# Patient Record
Sex: Female | Born: 1965 | Race: White | Hispanic: No | Marital: Married | Smoking: Never smoker
Health system: Southern US, Community
[De-identification: ages and names within clinical notes are randomized; demographics above are authoritative.]

## PROBLEM LIST (undated history)

## (undated) DIAGNOSIS — E119 Type 2 diabetes mellitus without complications: Secondary | ICD-10-CM

## (undated) DIAGNOSIS — M1990 Unspecified osteoarthritis, unspecified site: Secondary | ICD-10-CM

## (undated) DIAGNOSIS — J189 Pneumonia, unspecified organism: Secondary | ICD-10-CM

## (undated) DIAGNOSIS — G629 Polyneuropathy, unspecified: Secondary | ICD-10-CM

## (undated) HISTORY — DX: Pneumonia, unspecified organism: J18.9

## (undated) HISTORY — DX: Unspecified osteoarthritis, unspecified site: M19.90

## (undated) HISTORY — DX: Polyneuropathy, unspecified: G62.9

## (undated) HISTORY — DX: Type 2 diabetes mellitus without complications: E11.9

---

## 1999-09-01 ENCOUNTER — Other Ambulatory Visit: Admission: RE | Admit: 1999-09-01 | Discharge: 1999-09-01 | Payer: Self-pay | Admitting: Obstetrics and Gynecology

## 2000-10-11 ENCOUNTER — Other Ambulatory Visit: Admission: RE | Admit: 2000-10-11 | Discharge: 2000-10-11 | Payer: Self-pay | Admitting: Obstetrics and Gynecology

## 2001-01-04 ENCOUNTER — Inpatient Hospital Stay (HOSPITAL_COMMUNITY): Admission: AD | Admit: 2001-01-04 | Discharge: 2001-01-04 | Payer: Self-pay | Admitting: Obstetrics and Gynecology

## 2001-01-04 ENCOUNTER — Encounter: Payer: Self-pay | Admitting: Obstetrics and Gynecology

## 2001-02-28 ENCOUNTER — Encounter: Admission: RE | Admit: 2001-02-28 | Discharge: 2001-05-29 | Payer: Self-pay | Admitting: Obstetrics and Gynecology

## 2001-06-07 ENCOUNTER — Inpatient Hospital Stay (HOSPITAL_COMMUNITY): Admission: AD | Admit: 2001-06-07 | Discharge: 2001-06-07 | Payer: Self-pay | Admitting: Obstetrics and Gynecology

## 2001-08-18 ENCOUNTER — Inpatient Hospital Stay (HOSPITAL_COMMUNITY): Admission: AD | Admit: 2001-08-18 | Discharge: 2001-08-18 | Payer: Self-pay | Admitting: Obstetrics & Gynecology

## 2001-08-19 ENCOUNTER — Encounter (INDEPENDENT_AMBULATORY_CARE_PROVIDER_SITE_OTHER): Payer: Self-pay | Admitting: Specialist

## 2001-08-19 ENCOUNTER — Inpatient Hospital Stay (HOSPITAL_COMMUNITY): Admission: AD | Admit: 2001-08-19 | Discharge: 2001-08-22 | Payer: Self-pay | Admitting: Obstetrics and Gynecology

## 2001-09-15 ENCOUNTER — Encounter: Admission: RE | Admit: 2001-09-15 | Discharge: 2001-10-15 | Payer: Self-pay | Admitting: Obstetrics and Gynecology

## 2001-09-22 ENCOUNTER — Other Ambulatory Visit: Admission: RE | Admit: 2001-09-22 | Discharge: 2001-09-22 | Payer: Self-pay | Admitting: Obstetrics and Gynecology

## 2001-11-03 ENCOUNTER — Encounter: Payer: Self-pay | Admitting: Obstetrics and Gynecology

## 2001-11-03 ENCOUNTER — Encounter: Admission: RE | Admit: 2001-11-03 | Discharge: 2001-11-03 | Payer: Self-pay | Admitting: Obstetrics and Gynecology

## 2002-11-04 ENCOUNTER — Other Ambulatory Visit: Admission: RE | Admit: 2002-11-04 | Discharge: 2002-11-04 | Payer: Self-pay | Admitting: Obstetrics and Gynecology

## 2003-06-04 ENCOUNTER — Other Ambulatory Visit: Admission: RE | Admit: 2003-06-04 | Discharge: 2003-06-04 | Payer: Self-pay | Admitting: Obstetrics and Gynecology

## 2003-10-05 ENCOUNTER — Inpatient Hospital Stay (HOSPITAL_COMMUNITY): Admission: AD | Admit: 2003-10-05 | Discharge: 2003-10-05 | Payer: Self-pay | Admitting: Obstetrics and Gynecology

## 2003-12-07 ENCOUNTER — Encounter (INDEPENDENT_AMBULATORY_CARE_PROVIDER_SITE_OTHER): Payer: Self-pay | Admitting: Specialist

## 2003-12-07 ENCOUNTER — Inpatient Hospital Stay (HOSPITAL_COMMUNITY): Admission: RE | Admit: 2003-12-07 | Discharge: 2003-12-09 | Payer: Self-pay | Admitting: Obstetrics and Gynecology

## 2003-12-10 ENCOUNTER — Encounter: Admission: RE | Admit: 2003-12-10 | Discharge: 2004-01-09 | Payer: Self-pay | Admitting: Obstetrics and Gynecology

## 2004-01-18 ENCOUNTER — Other Ambulatory Visit: Admission: RE | Admit: 2004-01-18 | Discharge: 2004-01-18 | Payer: Self-pay | Admitting: Obstetrics and Gynecology

## 2004-09-14 ENCOUNTER — Other Ambulatory Visit: Admission: RE | Admit: 2004-09-14 | Discharge: 2004-09-14 | Payer: Self-pay | Admitting: Obstetrics and Gynecology

## 2004-09-25 ENCOUNTER — Observation Stay (HOSPITAL_COMMUNITY): Admission: AD | Admit: 2004-09-25 | Discharge: 2004-09-25 | Payer: Self-pay | Admitting: Obstetrics and Gynecology

## 2005-01-16 ENCOUNTER — Inpatient Hospital Stay (HOSPITAL_COMMUNITY): Admission: AD | Admit: 2005-01-16 | Discharge: 2005-01-16 | Payer: Self-pay | Admitting: Obstetrics and Gynecology

## 2005-02-19 ENCOUNTER — Inpatient Hospital Stay (HOSPITAL_COMMUNITY): Admission: AD | Admit: 2005-02-19 | Discharge: 2005-02-19 | Payer: Self-pay | Admitting: Obstetrics and Gynecology

## 2005-03-01 ENCOUNTER — Inpatient Hospital Stay (HOSPITAL_COMMUNITY): Admission: AD | Admit: 2005-03-01 | Discharge: 2005-03-03 | Payer: Self-pay | Admitting: Obstetrics and Gynecology

## 2005-03-01 ENCOUNTER — Encounter (INDEPENDENT_AMBULATORY_CARE_PROVIDER_SITE_OTHER): Payer: Self-pay | Admitting: *Deleted

## 2005-03-04 ENCOUNTER — Encounter: Admission: RE | Admit: 2005-03-04 | Discharge: 2005-04-03 | Payer: Self-pay | Admitting: Obstetrics and Gynecology

## 2005-04-04 ENCOUNTER — Other Ambulatory Visit: Admission: RE | Admit: 2005-04-04 | Discharge: 2005-04-04 | Payer: Self-pay | Admitting: Obstetrics and Gynecology

## 2007-04-18 HISTORY — PX: TUBAL LIGATION: SHX77

## 2008-05-14 ENCOUNTER — Encounter (INDEPENDENT_AMBULATORY_CARE_PROVIDER_SITE_OTHER): Payer: Self-pay | Admitting: Obstetrics and Gynecology

## 2008-05-14 ENCOUNTER — Ambulatory Visit (HOSPITAL_COMMUNITY): Admission: RE | Admit: 2008-05-14 | Discharge: 2008-05-14 | Payer: Self-pay | Admitting: Obstetrics and Gynecology

## 2011-01-30 NOTE — Op Note (Signed)
NAMECHARLOT, Brandi Weber             ACCOUNT NO.:  [id number]   MEDICAL RECORD NO.:  [id number]          PATIENT TYPE:  AMB   LOCATION:  SDC                           FACILITY:  [facility]   PHYSICIAN:  Guy Sandifer. Henderson Cloud, M.D. DATE OF BIRTH:  1966/02/05   DATE OF PROCEDURE:  05/14/2008  DATE OF DISCHARGE:                               OPERATIVE REPORT   PREOPERATIVE DIAGNOSES:  1. Menorrhagia.  2. Desires permanent sterilization.  3. Left ovarian cyst.   POSTOPERATIVE DIAGNOSIS:  1. Menorrhagia.  2. Desires permanent sterilization.  3. Left ovarian cyst.   PROCEDURES:  1. NovaSure endometrial ablation.  2. Laparoscopy with left ovarian cystectomy and bilateral tubal      ligation.  3. Hysteroscopy.  4. Dilatation and curettage.  5. Removal of intrauterine device.   SURGEON:  Guy Sandifer. Henderson Cloud, MD   ANESTHESIA:  General with endotracheal intubation.   SPECIMENS:  1. Endometrial curettings.  2. Left ovarian cystectomy.  3. IUD all to pathology.   ESTIMATED BLOOD LOSS:  Minimal.   COMPLICATIONS:  None.   I&O'S:  Sorbitol distending media 130 mL deficit with a lot of that  being on the floor.   INDICATIONS AND CONSENT:  This patient is a 45 year old married white  female G3, P3 with persistent ovarian cysts as well as heavy menses.  After careful discussion of the options, she is being admitted for  laparoscopy, probable ovarian cystectomy, possible LSO, possible RSO,  removal of the IUD, hysteroscopy, D&C, and NovaSure endometrial  ablation.  Potential risks and complications have been discussed  preoperatively including but limited to infection, uterine perforation,  organ damage, bleeding requiring transfusion of blood products with  possible HIV and hepatitis acquisition, DVT, PE, pneumonia.  Success and  failure rates of ablation have been reviewed.  Tubal ligation  permanence, failure rate, and increased ectopic risk have been reviewed.  All questions have been answered  and consent was signed on the chart.   FINDINGS:  Uterine cavity is without abnormal structure.  Fallopian tube  ostia identified bilaterally.  Abdominally upper abdomen is normal.  Appendix is normal.  Uterus is retroverted, 6-8 weeks in size, smooth in  contour.  Anterior and posterior cul-de-sacs were normal.  Right tube  and ovary normal.  Left tube is normal.  Left ovary contains a 2-3-cm  smooth cystic structure at the pole of the ovary.   PROCEDURE:  The patient is taken to operating room where she is  identified, placed in dorsal supine position and general anesthesia was  induced via endotracheal intubation.  She is then placed in the dorsal  lithotomy position where she is prepped abdominally and vaginally,  bladder straight catheterized, and she is draped in the sterile fashion.  Bivalve speculum was placed in the vagina.  The anterior cervical lip  was injected with 1% Xylocaine and grasped with a single-tooth  tenaculum.  Paracervical block was placed at 2, 4, 5, 7, 8, and 10  o'clock positions with approximately 20 mL of the same solution.  The  uterine cervix sounds to 4 cm.  The uterine cavity sounds  to 9 cm.  Cervix was progressively and gently dilated.  Diagnostic hysteroscope  was placed in the endocervical canal and advanced under direct  visualization using sorbitol distending media.  The above findings were  noted.  Hysteroscope was withdrawn.  Sharp curettage is carried out and  tissue was sent to pathology.  With the NovaSure endometrial ablation  device set on 5.0, it is placed in the endometrial canal.  Cavity width  is 4.2 cm.  The cavity test was passed on the first attempt.  Ablation  was then carried out.  Device was removed and inspection reveals it to  be intact.  The diagnostic hysteroscope is placed in the endocervical  canal and advanced under direct visualization again.  Inspection reveals  good cautery effect throughout the cavity.  No evidence of  perforation  was noted.  Hysteroscope was withdrawn and the Hulka tenaculum was used  to replace the single-tooth tenaculum.  Attention was turned to the  abdomen.  The infraumbilical and suprapubic areas were injected at  midline with one 0.25% plain Marcaine.  An infraumbilical incision was  made and a disposable Veress needle was placed on the first attempt  without difficulty.  A normal syringe and drop test were noted.  Two  liters of gas were insufflated under low pressure with good tympany in  the right upper quadrant.  Veress needle is removed.  A 10-11 Xcel  bladeless disposable trocar sleeve is placed with the diagnostic  laparoscope.  After placement, the operative laparoscope was placed.  A  small suprapubic incision is made in the midline and a 5-mm Xcel  bladeless disposable trocar sleeve was placed using direct visualization  without difficulty.  The above findings were noted.  The cyst on the  left ovary is then removed using disposable scissors with cautery which  resects the cyst in a simple fashion.  Good hemostasis is maintained.  The base of the cyst is cauterized and good hemostasis is noted.  This  was then removed from the abdomen.  The right fallopian tube is  identified from cornua to fimbriae and a Filshie clamp was placed on the  proximal one-third of the tube.  Similar procedure was carried out on  the left.  Reinspection after removal of the Filshie clip applicator  reveals that the full width of the fallopian tube was within the clip  bilaterally and the heel of the clip can be seen to the mesosalpinx  bilaterally.  Interceed is back loaded and placed around the left ovary.  Good hemostasis was noted.  Pneumoperitoneum was reduced and again good  hemostasis was noted.  Suprapubic trocar sleeve was removed and the  umbilical trocar sleeve was removed.  Skin incisions were closed with  interrupted 2-0 Vicryl cutaneous incisions.  Hulka tenaculum is removed   and no bleeding was noted.  All counts were correct.  The patient is  awakened and taken to recovery room in stable condition.      Guy Sandifer Henderson Cloud, M.D.  Electronically Signed     JET/MEDQ  D:  05/14/2008  T:  05/14/2008  Job:  [id number]

## 2011-01-30 NOTE — H&P (Signed)
Brandi Weber, OLBERDING             ACCOUNT NO.:  [id number]   MEDICAL RECORD NO.:  [id number]          PATIENT TYPE:  AMB   LOCATION:  SDC                           FACILITY:  [facility]   PHYSICIAN:  Guy Sandifer. Henderson Cloud, M.D. DATE OF BIRTH:  01/23/1966   DATE OF ADMISSION:  DATE OF DISCHARGE:                              HISTORY & PHYSICAL   CHIEF COMPLAINT:  Pelvic pain, ovarian cysts, heavy menses, and desires  permanent sterilization.   HISTORY OF PRESENT ILLNESS:  This patient is a 45 year old married white  female G3, P3 who has persistent ovarian cyst that appeared to be either  hemorrhagic or endometriomas on ultrasound.  She has had heavy recurrent  breakthrough bleeding on a variety of birth control pills and on the  IUD.  She currently has a Peri-Guard in place.  Ultrasound in my office  on April 27, 2008, revealed IUD to be in the lower uterine segment of  the uterus.  The right ovary was without masses.  The left ovary  contained a 2.5 cm cyst with 1 internal septation and a 2.2 cm cyst with  internal echoes probably consistent with endometrioma.  Uterus measured  10.2 x 4.3 x 6.6 cm.  After discussion of options, she is being admitted  for removal of IUD, hysteroscopy, D&C, NovaSure endometrial ablation,  laparoscopy, bilateral tubal ligation with Filshie clips, possible right  salpingo-oophorectomy, and possible left salpingo-oophorectomy.  All  questions have been answered and consent is signed on the chart.   PAST MEDICAL HISTORY:  Insulin-dependent diabetes.   MEDICATIONS:  Insulin via pump.   ALLERGIES:  SULFA and DOXYCYCLINE.   OBSTETRICAL HISTORY:  Cesarean section x3.   FAMILY HISTORY:  Positive for thrombophlebitis in mother and  hemoglobinopathy.   SURGICAL HISTORY:  Negative except for cesarean sections.   REVIEW OF SYSTEMS:  NEURO:  Denies headache.  CARDIAC:  Denies chest  pain.  PULMONARY:  Denies shortness of breath.   SOCIAL HISTORY:  Denies tobacco,  alcohol, or drug abuse.   PHYSICAL EXAMINATION:  VITAL SIGNS:  Height 5 feet 2 inches, weight 135  pounds, and blood pressure 110/62.  HEENT: Without thyromegaly.  LUNGS: Clear to auscultation.  HEART: Regular rate and rhythm.  BACK:  Without CVA tenderness.  ABDOMEN: Soft and nontender without masses.  PELVIC:  Vulva, vagina, and cervix without lesion.  IUD string noted.  Uterus, upper normal size, mobile, and mildly tender.  Adnexa, nontender  without masses.  EXTREMITIES:  Grossly within normal limits.  NEUROLOGIC:  Grossly within normal limits.   ASSESSMENT:  1. Menorrhagia.  2. Dysmenorrhea.  3. Left ovarian cysts.   PLAN:  Hysteroscopy, D&C, NovaSure endometrial ablation, laparoscopy,  possible left salpingo-oophorectomy, possible right salpingo-  oophorectomy, bilateral tubal ligation with Filshie clips and removal of  IUD.      Guy Sandifer Henderson Cloud, M.D.  Electronically Signed     JET/MEDQ  D:  04/28/2008  T:  04/29/2008  Job:  [id number]

## 2011-02-02 NOTE — Discharge Summary (Signed)
NAMEVERNELLA, NIZNIK             ACCOUNT NO.:  [id number]   MEDICAL RECORD NO.:  [id number]          PATIENT TYPE:  INP   LOCATION:  9127                          FACILITY:  [facility]   PHYSICIAN:  Duke Salvia. Marcelle Overlie, M.D.DATE OF BIRTH:  1965/11/16   DATE OF ADMISSION:  03/01/2005  DATE OF DISCHARGE:  03/03/2005                                 DISCHARGE SUMMARY   ADMITTING DIAGNOSIS:  1.  Intrauterine pregnancy at 35-2/7 weeks estimated gestational age  68.  Insulin-dependent diabetes  3.  Polyhydramnios  4.  Fetal distress  5.  Previous cesarean section desires repeat.   DISCHARGE DIAGNOSIS:  1.  Status post low transverse cesarean section  2.  Viable female infant  3.  Insulin-dependent diabetes.   PROCEDURE:  Repeat low transverse cesarean section.   REASON FOR ADMISSION:  Please see dictated H&P.   HOSPITAL COURSE:  The patient is a 45 year old gravida 3, para 2 was  admitted to [facility] for a cesarean delivery. The patient  had been seen in the office for routine obstetrical care. Her pregnancy had  been complicated by insulin-dependent diabetes currently on insulin pump.  The patient had noted some decrease in fetal movement over last several  days. On antenatal fetal testing which was done in the office the patient  was noted to have a nonreactive nonstress test that was followed by a  biophysical profile with score 2/8. Fetus was noted in the vertex  presentation. AFI was noted to be 43.2 consistent with polyhydramnios.  Because of a poor biophysical profile delivery was recommended. The patient  was also noted to have 2+ proteinuria and mildly elevated blood pressure of  140/78. The patient was then taken to the operating room where spinal  anesthesia was administered without difficulty. Low transverse incision was  made with the delivery of a viable female infant weighing 9 pounds 8 ounces  with Apgars of 4 at one minute and 5 at 5 minutes, 7 at 10  minutes. Arterial  cord pH was 7.31. The patient tolerated procedure well and taken to the  recovery room in stable condition. Baby was transferred to the NICU and  placed on ventilator. On postoperative day #1 the patient without complaint.  Vital signs were stable. She was afebrile. Abdomen soft. Fundus was firm and  nontender. Abdominal dressing was noted to be clean, dry and intact. The  patient is ambulating well. Laboratory findings revealed hemoglobin of 8.8,  platelet count of 221,000, WBC count of 9.8. Capillary blood glucoses were  115-149 currently on Glucomander. On the following morning the patient was  without complaint. Vital signs stable. She was afebrile. Hemoglobin was  noted to be 8.0. Incision was clean, dry and intact. The patient was placed  back onto an insulin pump. She was placed on a low carbohydrate diet,  discharge instructions reviewed and the patient was later discharged home.   CONDITION ON DISCHARGE:  Stable.   DIET:  Regular as tolerated.   ACTIVITY:  No heavy lifting, no driving x2 weeks, no vaginal entry.   FOLLOW-UP:  Patient follow up in the  office in 2-3 days for the staple  removal. She is to call for temperature greater than 100 degrees, persistent  nausea and vomiting, heavy vaginal bleeding and/or redness or drainage from  incisional site. The patient is also to follow up with Dr. Evlyn Kanner to make  maintenance of insulin pump.   DISCHARGE MEDICATIONS:  1.  Tylox #30 one p.o. every 4 to 6 hours p.r.n.  2.  Motrin 600 milligrams every 6 hours.  3.  Prenatal vitamins one p.o. daily  4.  Insulin pump managed by Dr. Evlyn Kanner       CC/MEDQ  D:  04/09/2005  T:  04/09/2005  Job:  [id number]

## 2011-02-02 NOTE — Discharge Summary (Signed)
NAME:  Brandi Weber, Brandi Weber                       ACCOUNT NO.:  [id number]   MEDICAL RECORD NO.:  [id number]                   PATIENT TYPE:  INP   LOCATION:  9108                                 [facility]ACILITY:  [facility]   PHYSICIAN:  Duke Salvia. Marcelle Overlie, M.D.            DATE OF BIRTH:  1966-08-21   DATE OF ADMISSION:  12/07/2003  DATE OF DISCHARGE:  12/09/2003                                 DISCHARGE SUMMARY   ADMITTING DIAGNOSES:  1. Intrauterine pregnancy at 37 weeks estimated gestational age.  2. Insulin-dependent diabetes mellitus.  3. Mature amniocentesis.  4. Previous cesarean section, desires repeat.   DISCHARGE DIAGNOSES:  1. Status post low transverse cesarean section.  2. Viable female infant.   PROCEDURE:  Repeat low transverse cesarean section.   REASON FOR ADMISSION:  Please see dictated H&P.   HOSPITAL COURSE:  The patient was a 45 year old white married female gravida  2 para 1 who was admitted to [facility] for a scheduled  repeat cesarean delivery at 37 weeks estimated gestational age.  The patient  had had amniocentesis on the day prior to admission with a positive L/S  ratio of 5.6:1 with positive PG.  Options had been given to the patient  regarding possible vaginal birth after cesarean versus repeat.  The patient  declined trial of labor.  On the morning of admission the patient was taken  to the operating room where spinal anesthesia was administered without  difficulty.  A low transverse incision was made with the delivery of viable  female infant weighing 9 pounds 1 ounce with Apgars of 8 at one minute and 9  at five minutes.  Umbilical cord pH was 7.29.  The patient tolerated the  procedure well and was taken to the recovery room in stable condition.  On  postoperative day #1 vital signs were stable; she was afebrile.  Capillary  blood sugars were within normal limits.  Abdominal dressing was noted to be  clean, dry, and intact.  Abdomen was soft  with good return of bowel  function.  On postoperative day #2 vital signs were stable.  The baby was  noted to have some elevation of bilirubin.  The patient remained afebrile.  Abdomen was soft, fundus firm and nontender.  Abdominal dressing had been  removed revealing an incision that was clean, dry, and intact.  Some slight  ecchymosis was noted superior and inferior to the incisional site.  The  patient was ambulating well, tolerating a regular diet without complaints of  nausea and vomiting.  She desired early discharge.   CONDITION ON DISCHARGE:  Good.   DIET:  Regular as tolerated.   ACTIVITY:  No heavy lifting, no driving x2 weeks, no vaginal entry.   FOLLOW-UP:  The patient is to follow up in the office in 2-3 days for a  staple removal and incision check.  She is to call for temperature greater  than 100  degrees, persistent nausea and vomiting, heavy vaginal bleeding,  and/or redness or drainage from the incisional site.   DISCHARGE MEDICATIONS:  1. Percocet 5/325 #30 one p.o. q.4-6h. p.r.n.  2. Motrin 600 mg q.6h.  3. Prenatal vitamins one p.o. daily.  4. Colace one p.o. daily p.r.n.  5. Insulin pump per endocrinologist.     Julio Sicks, N.P.                        Richard M. Marcelle Overlie, M.D.    CC/MEDQ  D:  01/07/2004  T:  01/07/2004  Job:  [id number]

## 2011-02-02 NOTE — Discharge Summary (Signed)
[facility]  Patient:    Brandi Weber, Brandi Weber Visit Number: [id number] MRN: [id number]          Service Type: OBS Location: 9300 [id number] Attending Physician:  Cordelia Pen Ii Dictated by:   Danie Chandler, R.N. Admit Date:  08/19/2001 Discharge Date: 08/22/2001                             Discharge Summary  ADMITTING DIAGNOSES:          1. Intrauterine pregnancy at 37-1/7 weeks                                  estimated gestational age.                               2. Pregnancy induced hypertension.                               3. Breech presentation.                               4. Diabetes.  DISCHARGE DIAGNOSES:          1. Intrauterine pregnancy at 37-1/7 weeks                                  estimated gestational age.                               2. Pregnancy induced hypertension.                               3. Breech presentation.                               4. Diabetes.  PROCEDURES:                   On August 19, 2001, primary low transverse cesarean section.  REASON FOR ADMISSION:         The patient is a 45 year old married white female, gravida 1, para 0 at 37-1/[redacted] weeks gestation.  The patient has preexisting diabetes that has been controlled with the insulin pump.  The baby was noted to be in the breech presentation.  Over the last week or so, the patient had developed elevated blood pressures, as well as progressively worsening proteinuria and peripheral edema.  Elevation in the office on August 18, 2001 revealed blood pressures of 140s/80s-90s.  The patient had 3+ protein.  Laboratory evaluation was normal and the baby was reactive on nonstress test.  The recommendation for delivery was made.  HOSPITAL COURSE:            The patient was taken to the operating room and underwent the above-named procedure, without complication.  This was productive of a viable female infant with Apgars of 7 at one minute and 9  at five minutes and an arterial cord pH of 7.33.  Postoperatively, on day No. 1, the  patients hemoglobin was 10.0, hematocrit 28.2, white blood cell count 9.0 and platelets 174,000.  On postoperative day #1, the patient was on magnesium sulfate.  On postoperative day #2, she was placed on routine care.  Her vital signs were stable.  The patient had a good return of bowel function and was tolerating a regular diet.  She also had good pain control and was ambulating well without difficulty.  She was discharged home on postoperative day #3.  CONDITION ON DISCHARGE:       Good.  DIET:                         Regular, as tolerated.  FOLLOW-UP:                    She is to return to the office next week for staple removal and she is to call for temperature greater than 100 degrees, persistent nausea or vomiting, heavy vaginal bleeding and/or redness or drainage from the incision site.  DISCHARGE MEDICATIONS:        1. Prenatal vitamin one p.o. q.d.                               2. Pain medications as directed by MD.                               3. Insulin per Dr. Evlyn Kanner. Dictated by:   Danie Chandler, R.N. Attending Physician:  Soledad Gerlach DD:  08/22/01 TD:  08/22/01 Job: [id number]

## 2011-02-02 NOTE — Op Note (Signed)
NAME:  Brandi Weber, Brandi Weber                       ACCOUNT NO.:  [id number]   MEDICAL RECORD NO.:  [id number]                   PATIENT TYPE:  INP   LOCATION:  9108                                 FACILITY:  [facility]   PHYSICIAN:  Guy Sandifer. Arleta Creek, M.D.           DATE OF BIRTH:  17-Jul-1966   DATE OF PROCEDURE:  12/07/2003  DATE OF DISCHARGE:                                 OPERATIVE REPORT   PREOPERATIVE DIAGNOSIS:  1. Intrauterine gestation at approximately 22 weeks estimated gestational     age.  2. Diabetes mellitus.  3. Mature amniocentesis.  4. Previous cesarean section, desires repeat.   POSTOPERATIVE DIAGNOSES:  1. Intrauterine gestation at approximately 49 weeks estimated gestational     age.  2. Diabetes mellitus.  3. Mature amniocentesis.  4. Previous cesarean section, desires repeat.   PROCEDURE:  Low transverse cesarean section.   SURGEON:  Harold Hedge, M.D.   ANESTHESIA:  Spinal - Cline Crock, M.D.   ESTIMATED BLOOD LOSS:  600 mL.   SPECIMENS:  Placenta.   FINDINGS:  Viable female infant, Apgars of 8 and 9 at one and five minutes  respectively, birth weight 9 pounds 1 ounce, arterial cord pH 7.29.   INDICATIONS AND CONSENTS:  The patient is a 45 year old married white female  G2 P1 with an EDC of December 31, 2003.  Prenatal care has been complicated by  diabetes.  The patient has been maintained on insulin pump.  Ultrasound was  normal anatomy.  The patient declined second trimester amniocentesis or AFP  screening.  Amniocentesis on December 06, 2003 revealed an L/S ration of 5.6  and a positive PG.  Options of delivery have been discussed and the patient  wants a repeat cesarean section.  Potential risks and complications have  been discussed preoperatively.   DESCRIPTION OF PROCEDURE:  The patient is taken to the operating room where  she is identified.  Spinal anesthetic is placed and she is placed in the  dorsal supine position with a 15-degree left lateral  wedge.  She is prepped,  Foley catheter is placed and the bladder is drained, and she is draped in a  sterile fashion.  After testing for adequate spinal anesthesia the skin is  entered through the previous Pfannenstiel scar and dissection is carried out  in layers to the peritoneum.  The peritoneum is incised and extended  superiorly and inferiorly.  The vesicouterine peritoneum is taken down  cephalolaterally, the bladder flap is developed, and bladder blade is  placed.  The uterus is incised in a low transverse manner and the uterine  cavity is entered bluntly with a Kelly clamp.  The uterine incision is  extended cephalolaterally with the fingers.  Artificial rupture of membranes  for clear fluid is then carried out.  The vertex is delivered and the  oronasal pharynx is suctioned.  The remainder of the baby is delivered and a  good cry and  tone are noted.  The cord is clamped and cut and the infant is  handed to the waiting pediatrics team.  The placenta is manually delivered  and sent to pathology.  The uterine cavity is cleaned.  The uterus is closed  in a running locking layer of 0 Monocryl suture which achieves good  hemostasis.  Tubes and ovaries palpate normally.  After copious irrigation  the anterior peritoneum was closed in running fashion with 0 Monocryl suture  which is also used to approximate the pyramidalis muscle in the midline.  Anterior rectus fascia is closed in running fashion with 0 PDS suture and  the skin is closed with clips.  All sponge, instrument, and needle counts  are correct.  The patient is transferred to the recovery room in stable  condition.                                               Guy Sandifer Arleta Creek, M.D.    JET/MEDQ  D:  12/07/2003  T:  12/08/2003  Job:  [id number]

## 2011-02-02 NOTE — Op Note (Signed)
[facility]  Patient:    Brandi Weber, Brandi Weber Visit Number: [id number] MRN: [id number]          Service Type: OBS Location: 910B [id number] [id number] Attending Physician:  Soledad Gerlach Dictated by:   Guy Sandifer Arleta Creek, M.D. Proc. Date: 08/19/01 Admit Date:  08/19/2001                             Operative Report  PREOPERATIVE DIAGNOSES:       1. Intrauterine pregnancy at 14 1/7 weeks                                  estimated gestational age.                               2. Pregnancy induced hypertension.                               3. Breech presentation.                               4. Diabetes.  POSTOPERATIVE DIAGNOSES:      1. Intrauterine pregnancy at 37 1/7 weeks                                  estimated gestational age.                               2. Pregnancy induced hypertension.                               3. Breech presentation.                               4. Diabetes.  PROCEDURE:                    Primary low transverse cesarean section.  SURGEON:                      Guy Sandifer. Arleta Creek, M.D.  ANESTHESIA:                   Spinal by E.J. Oddono, M.D.  ESTIMATED BLOOD LOSS:         800 cc.  FINDINGS:                     Viable female infant.  Apgars of 7 and 9 at one and five minutes respectively.  Birth weight 9 pounds 1 ounce.  Arterial cord pH 7.33.  INDICATIONS AND CONSENT:      This patient is a 45 year old married white female G1, P0 at 44 1/7 weeks.  She has preexisting diabetes that is being controlled with an insulin pump.  The baby is noted to be breech.  Over the last week or so she has had elevated blood pressures as well as progressively worsening proteinuria and peripheral edema.  Evaluation yesterday in the office  revealed blood pressures of 140s/80-90s.  She had 3+ protein. Laboratory evaluation was normal and the baby was reactive on nonstress test. Recommendation for delivery was made.  Potential for  pulmonary lung immaturity in the baby was discussed.  Potential risks and complications were discussed including, but not limited to, infection, bowel, bladder, ureteral damage, bleeding requiring transfusion of blood products with possible transfusion reaction, HIV and hepatitis acquisition, DVT, PE, pneumonia.  All questions answered.  Consent is signed and on the chart.  PROCEDURE:                    Patient was taken to the operating room.  Her insulin pump is discontinued prior to transfer to the operating room.  Spinal anesthetic was placed.  She was then placed in the dorsal supine position with a 15 degree left lateral wedge.  She is prepped.  Foley catheter is placed in the bladder as a drain.  She is draped in a sterile fashion.  After testing for adequate spinal anesthesia, skin was entered through a Pfannenstiel incision and dissection was carried out in layers through the peritoneum. Peritoneum is incised and extended superiorly and inferiorly.  The vesicouterine peritoneum was taken down cephalolaterally.  The bladder flap was developed and the bladder blade was placed.  Uterus is incised in a low transverse manner and the uterus is entered bluntly with a hemostat.  The uterine incision is extended cephalolaterally with the fingers.  Artificial rupture of membranes for clear fluid is carried out.  The infant is then delivered from the breech presentation without difficulty.  Oro and nasopharynx are suctioned.  Cord is clamped and cut and the infant is transferred to the awaiting pediatrics team.  Good cry and tone is also noted. Placenta is manually delivered and sent to pathology.  The internal uterine contour is normal and the cavity is clean.  The uterus is closed in a running locking layer of 0 Monocryl suture stating at each angle and meeting in the middle.  Tubes and ovaries are normal bilaterally.  The anterior peritoneum is closed in a running fashion with 0 Monocryl  suture which is also used to reapproximate the pyramidalis muscle in the midline.  The anterior rectus fascia is closed in a running fashion with 0 PDS suture and the skin is closed with clips.  All sponge, instrument, and needle counts are correct.  Patient is transferred to the recovery room in stable condition.  Patient will be placed on postoperative magnesium sulfate.  Dr. Evlyn Kanner will also check on the patient for her insulin status. Dictated by:   Guy Sandifer Arleta Creek, M.D. Attending Physician:  Soledad Gerlach DD:  08/19/01 TD:  08/19/01 Job: [id number]

## 2011-02-02 NOTE — Op Note (Signed)
NAMEPARRIE, Brandi Weber             ACCOUNT NO.:  [id number]   MEDICAL RECORD NO.:  [id number]          PATIENT TYPE:  INP   LOCATION:  9127                          [facility]ILITY:  [facility]   PHYSICIAN:  Dineen Kid. Rana Snare, M.D.    DATE OF BIRTH:  [birth year]   DATE OF PROCEDURE:  03/01/2005  DATE OF DISCHARGE:                                 OPERATIVE REPORT   PREOPERATIVE DIAGNOSES:  1. Intrauterine pregnancy at 35-2/7 weeks.  2. Polyhydramnios.  3. Insulin-dependent diabetes.  4. Fetal distress.  5. Previous cesarean section, desiring repeat.   POSTOPERATIVE DIAGNOSES:  1. Intrauterine pregnancy at 35-2/7 weeks.  2. Polyhydramnios.  3. Insulin-dependent diabetes.  4. Fetal distress.  5. Previous cesarean section, desiring repeat.   PROCEDURE:  Repeat low segment transverse cesarean section.   SURGEON:  Dineen Kid. Rana Snare, M.D.   ANESTHESIA:  Spinal.   INDICATIONS:  Ms. Brandi Weber is a 45 year old G3, P2, with insulin-dependent  diabetes and polyhydramnios, advanced maternal age, who was undergoing  routine antenatal fetal surveillance today, had a nonreactive NST and was  suspicious for fetal distress, very flat reactivity.  No obvious  decelerations, however.  Underwent a biophysical profile which showed a  biophysical of 2/8 for a total of 2/10.  Because of fetal distress as  evidenced by very poor biophysical profile and also the fact that Brandi Weber  had noted decreasing insulin demands this week and low blood sugars, we plan  to proceed with primary low segment transverse cesarean section.  She was  immediately taken from our office straight to the operating room, where  cesarean section was carried out.  Rib were discussed, informed consent was  obtained.   FINDINGS AT THE TIME OF SURGERY:  A viable female infant, Apgars were 4, 6 and  7, the pH arterial is currently pending.  There was a massive amount of  amniotic fluid, greater than 5 L, noted.   DESCRIPTION OF PROCEDURE:  After  adequate analgesia, the patient was placed  in a supine position, left lateral tilt.  She was sterilely prepped and  draped.  The bladder was sterilely drained with a Foley catheter.  A  Pfannenstiel skin incision was made two fingerbreadths above the pubic  symphysis, taken down sharply to the fascia, which was incised transversely,  extended superiorly and inferiorly off the bellies of the rectus muscle,  which were separated sharply in the midline.  The peritoneum was entered  sharply.  The bladder was noted to be well below the bulging portion of the  lower segment of the uterus, so a low segment myotomy incision was made down  to the amniotic sac and a copious amount of fluid was noted.  The infant's  vertex was delivered atraumatically, the nares and pharynx suctioned, a  nuchal cord x1 easily reduced.  The infant was delivered, the cord clamped,  cut, and handed to the pediatricians for resuscitation.  Cord blood was  obtained, the placenta extracted manually.  It was sent to pathology.  The  uterus was exteriorized, wiped clean with a dry lap.  The myotomy incision  was  closed in two layers, the first being a running locking layer, the  second being an imbricating layer of 0 Monocryl suture.  After adequate  hemostasis was achieved, the uterus was placed back in the peritoneal cavity  and after a copious amount of irrigation, adequate hemostasis achieved.  The  peritoneum was closed with 0 Monocryl in running fashion, the rectus muscle  plicated in the midline.  Irrigation was applied and after adequate  hemostasis, the fascia was closed with #1 PDS in a running fashion.  Irrigation was applied and after adequate hemostasis, the skin was stapled,  Steri-Strips applied.  The patient tolerated the procedure well, was stable  on transfer to the recovery room.  Sponge and instrument count was normal  x3.  Estimated blood loss was 500 mL.  The patient did receive 1 g of  Rocephin after  delivery of the placenta.  The patient will be converted to a  Glucommander and insulin drip tonight.  Dr. Evlyn Weber is planning on following  the patient tomorrow for the conversion back to the insulin pump.  The  infant did go to the NICU for further observation.       DCL/MEDQ  D:  03/01/2005  T:  03/02/2005  Job:  [id number]

## 2011-02-02 NOTE — H&P (Signed)
NAMEHENNESY, SOBALVARRO             ACCOUNT NO.:  [id number]   MEDICAL RECORD NO.:  [id number]          PATIENT TYPE:  INP   LOCATION:  9198                          [facility]ILITY:  [facility]   PHYSICIAN:  Dineen Kid. Rana Snare, M.D.    DATE OF BIRTH:  Jul 04, 1966   DATE OF ADMISSION:  03/01/2005  DATE OF DISCHARGE:                                HISTORY & PHYSICAL   HISTORY OF PRESENT ILLNESS:  Ms. Mickiewicz is a 45 year old G3 P2 at 21 and  two-sevenths weeks gestational age who presented to the office for routine  obstetrical care. Pregnancy has been complicated by insulin-dependent  diabetes on an insulin pump. She has had decreased fetal movement over the  last several days. On antenatal fetal surveillance when she presents to the  office normally she does have a nonreactive NST which is usually followed by  a biophysical. The last several have been 8/8, the latest being on June 12.  Today, with a nonreactive NST underwent an ultrasound evaluation which has a  biophysical of 2/8, vertex presentation, AFI of 43.2 consistent with  polyhydramnios. Because of poor biophysical profile, recommend delivery. Her  estimated date of confinement is April 03, 2005. She also has 2+ protein and  mildly elevated blood pressure of 140/78. She also has 2+ glucosuria.   PAST SURGICAL HISTORY:  Significant for a cesarean section in March 2005 and  in 2002.   PAST MEDICAL HISTORY:  Significant for insulin-dependent diabetes followed  by Dr. Adrian Prince, currently on insulin pump. She also has the history of  depression, previously on Zoloft.   MEDICATIONS:  Include prenatal vitamins and insulin.   She has an allergy to SULFA and DOXYCYCLINE.   PHYSICAL EXAMINATION TODAY:  VITAL SIGNS:  Her blood pressure is 140/74,  weight is 175. Ultrasound shows a 2/8 biophysical with AFI of 43.2 which is  97 percentile.   IMPRESSION AND PLAN:  Intrauterine pregnancy at 86 and two-sevenths weeks,  insulin-dependent diabetes, now  with falling insulin levels and fetus is  nonreassuring today.   PLAN:  Delivery of the fetus. Recommend repeat cesarean section. Risks and  benefits of the C-section were discussed at length. We will also plan to  switch her over to insulin pump with the Glucommander today. Dr. Evlyn Kanner will  hopefully follow her post delivery. The patient is also advanced maternal  age and declined AFP and further evaluation of that.       DCL/MEDQ  D:  03/01/2005  T:  03/01/2005  Job:  [id number]

## 2012-12-26 ENCOUNTER — Other Ambulatory Visit: Payer: Self-pay | Admitting: Internal Medicine

## 2012-12-26 ENCOUNTER — Other Ambulatory Visit: Payer: Self-pay | Admitting: Geriatric Medicine

## 2012-12-26 DIAGNOSIS — M542 Cervicalgia: Secondary | ICD-10-CM

## 2012-12-29 ENCOUNTER — Inpatient Hospital Stay
Admission: RE | Admit: 2012-12-29 | Discharge: 2012-12-29 | Disposition: A | Discharge status: Home / Self Care | Payer: Self-pay | Source: Ambulatory Visit | Attending: Internal Medicine | Admitting: Internal Medicine

## 2013-01-01 ENCOUNTER — Other Ambulatory Visit: Payer: Self-pay

## 2015-11-01 ENCOUNTER — Encounter: Payer: Managed Care, Other (non HMO) | Attending: Internal Medicine | Admitting: *Deleted

## 2015-11-01 DIAGNOSIS — E1065 Type 1 diabetes mellitus with hyperglycemia: Secondary | ICD-10-CM | POA: Diagnosis present

## 2015-11-01 DIAGNOSIS — E108 Type 1 diabetes mellitus with unspecified complications: Secondary | ICD-10-CM

## 2015-11-01 DIAGNOSIS — IMO0002 Reserved for concepts with insufficient information to code with codable children: Secondary | ICD-10-CM

## 2015-11-04 ENCOUNTER — Encounter: Payer: Self-pay | Admitting: *Deleted

## 2015-11-04 NOTE — Progress Notes (Signed)
Insulin Pump Evaluation Visit:  Date: 11/01/15   Appt start time: 1400 end time:  1530.  Assessment:  This patient has DM 1 and their primary concerns today: to learn more about the Medtronic 630G/670G insulin pumps. She has recent hospitalization for DKA, but states that is the 1st time she has been in DKA in the 40 years she has had DM1. She is here with her husband who participated in the visit and appears supportive. She states she has been on Medtronic pumps for about 14 years and currently uses the [employer]ette infusion set  MEDICATIONS: Novolog in Medtronic 530G insulin pump  Patient states knowledge of Carb Counting is 8 on a scale of 1-10  Usual physical activity: none  Last A1c was 14%     Intervention:    discussed the Medtronic 630G and 670G insulin pumps  Discussed current Continuous Glucose Monitoring options and potential for Low Glucose Suspend and Basal Rate management with CGM  Informed patient of DM1/Pump Support Group for ongoing education and support  Handouts given during visit include:  Introduction to Pump Therapy Handout with CGM information handwritten on back  DM 1 Support Group Flyer  Monitoring/Evaluation:    Patient does want to continue with pursuit of Medtronic insulin pump but is not aware of Warranty date.  Patient asked me to contact local [employer] via e-mail so they can start the process of obtaining the pump and determine warranty options. Contact information provided to the patient.  Patient instructed to go to that pump web-site to complete any learning module on insulin pump prior to next visit  Once pump is shipped, patient to call this office to set up training.

## 2016-01-24 ENCOUNTER — Other Ambulatory Visit: Payer: Self-pay | Admitting: Internal Medicine

## 2016-01-24 DIAGNOSIS — M79604 Pain in right leg: Secondary | ICD-10-CM

## 2016-01-26 ENCOUNTER — Ambulatory Visit
Admission: RE | Admit: 2016-01-26 | Discharge: 2016-01-26 | Disposition: A | Discharge status: Home / Self Care | Payer: Managed Care, Other (non HMO) | Source: Ambulatory Visit | Attending: Internal Medicine | Admitting: Internal Medicine

## 2016-01-26 DIAGNOSIS — M79604 Pain in right leg: Secondary | ICD-10-CM

## 2016-04-05 ENCOUNTER — Other Ambulatory Visit: Payer: Self-pay | Admitting: Internal Medicine

## 2016-04-05 DIAGNOSIS — M79605 Pain in left leg: Secondary | ICD-10-CM

## 2016-04-05 DIAGNOSIS — M79661 Pain in right lower leg: Secondary | ICD-10-CM

## 2016-04-09 ENCOUNTER — Ambulatory Visit
Admission: RE | Admit: 2016-04-09 | Discharge: 2016-04-09 | Disposition: A | Discharge status: Home / Self Care | Payer: Managed Care, Other (non HMO) | Source: Ambulatory Visit | Attending: Internal Medicine | Admitting: Internal Medicine

## 2016-04-09 DIAGNOSIS — M79661 Pain in right lower leg: Secondary | ICD-10-CM

## 2016-04-09 DIAGNOSIS — M79605 Pain in left leg: Secondary | ICD-10-CM

## 2016-04-10 ENCOUNTER — Other Ambulatory Visit: Payer: Managed Care, Other (non HMO)

## 2016-05-16 ENCOUNTER — Encounter: Payer: Self-pay | Admitting: *Deleted

## 2016-05-18 ENCOUNTER — Encounter: Payer: Self-pay | Admitting: Diagnostic Neuroimaging

## 2016-05-18 ENCOUNTER — Ambulatory Visit (INDEPENDENT_AMBULATORY_CARE_PROVIDER_SITE_OTHER): Payer: Managed Care, Other (non HMO) | Admitting: Diagnostic Neuroimaging

## 2016-05-18 VITALS — BP 115/75 | HR 87 | Ht 62.0 in | Wt 130.4 lb

## 2016-05-18 DIAGNOSIS — E1042 Type 1 diabetes mellitus with diabetic polyneuropathy: Secondary | ICD-10-CM

## 2016-05-18 NOTE — Progress Notes (Signed)
[facility]  PATIENT: Brandi Weber DOB: 06-12-1966  REFERRING CLINICIAN: Barbette Hair HISTORY FROM: patient and husband  REASON FOR VISIT: new consult    HISTORICAL  CHIEF COMPLAINT:  Chief Complaint  Patient presents with  . Pain    rm 6, New Pt, husband -[name], "pain in R leg x 6 mos- circulation/musculoskeletal issues ruled out"    HISTORY OF PRESENT ILLNESS:   50 year old right-handed female here for evaluation of lower extremity pain. Patient has history of insulin-dependent type 1 diabetes since age 6 years old. Patient had done well until last year when she became somewhat burnt out with her diabetes management and therefore developed poor diabetes control. Ultimately this led to diabetic ketoacidosis and hospitalization in February 2017. Hemoglobin A1c was up to 15. Following this patient started to have increasing pain in her lower extremities especially her right leg. Patient had 3 different types of pain: Bilateral toe numbness and burning, right greater than left lower extremity pain, right thigh pain. Patient is tried gabapentin without relief. Cymbalta was added which started to help symptom control. She purchased Station cream but has not tried this yet.  For past 8-10 years patient has had intermittent restless leg symptoms, foot cramps, especially at nighttime before going to sleep. She typically would have to stand up and walk around.  Patient denies any significant low back pain. She denies low back pain radiating to legs. No problems with her hands or arms.  Patient is now having better diabetes control and hemoglobin A1c is down to 10.   REVIEW OF SYSTEMS: Full 14 system review of systems performed and negative with exception of: Swelling in legs itching feeling cold.   ALLERGIES: Allergies  Allergen Reactions  . Doxycycline     Seeing spots  . Sulfa Antibiotics     Seeing spots  . Tape     Surgical tape     HOME  MEDICATIONS: Outpatient Medications Prior to Visit  Medication Sig Dispense Refill  . insulin aspart (NOVOLOG) 100 UNIT/ML injection Inject into the skin 3 (three) times daily before meals. In insulin pump     No facility-administered medications prior to visit.     PAST MEDICAL HISTORY: Past Medical History:  Diagnosis Date  . Diabetes mellitus without complication (HCC)    insulin dependent  . Neuropathy ([place])    diabetic    PAST SURGICAL HISTORY: Past Surgical History:  Procedure Laterality Date  . CESAREAN SECTION     x 3  . TUBAL LIGATION  04/2007    FAMILY HISTORY: Family History  Problem Relation Age of Onset  . Cancer Maternal Grandfather     SOCIAL HISTORY:  Social History   Social History  . Marital status: Married    Spouse name: [name]  . Number of children: 4  . Years of education: 35   Occupational History  .      na   Social History Main Topics  . Smoking status: Never Smoker  . Smokeless tobacco: Not on file  . Alcohol use Yes     Comment: occasional  . Drug use: No  . Sexual activity: Not on file   Other Topics Concern  . Not on file   Social History Narrative   Lives at home with husband, 4 children   Caffeine -     PHYSICAL EXAM  GENERAL EXAM/CONSTITUTIONAL: Vitals:  Vitals:   05/18/16 1058  BP: 115/75  Pulse: 87  Weight: 130 lb 6.4 oz (  59.1 kg)  Height: 5\' 2"  (1.575 m)     Body mass index is 23.85 kg/m.  Visual Acuity Screening   Right eye Left eye Both eyes  Without correction:     With correction: 20/50 20/40      Patient is in no distress; well developed, nourished and groomed; neck is supple  CARDIOVASCULAR:  Examination of carotid arteries is normal; no carotid bruits  Regular rate and rhythm, no murmurs  Examination of peripheral vascular system by observation and palpation is normal  EYES:  Ophthalmoscopic exam of optic discs and posterior segments is normal; no papilledema or  hemorrhages  MUSCULOSKELETAL:  Gait, strength, tone, movements noted in Neurologic exam below  NEUROLOGIC: MENTAL STATUS:  No flowsheet data found.  awake, alert, oriented to person, place and time  recent and remote memory intact  normal attention and concentration  language fluent, comprehension intact, naming intact,   fund of knowledge appropriate  CRANIAL NERVE:   2nd - no papilledema on fundoscopic exam  2nd, 3rd, 4th, 6th - pupils equal and reactive to light, visual fields full to confrontation, extraocular muscles intact, no nystagmus  5th - facial sensation symmetric  7th - facial strength symmetric  8th - hearing intact  9th - palate elevates symmetrically, uvula midline  11th - shoulder shrug symmetric  12th - tongue protrusion midline  MOTOR:   normal bulk and tone, full strength in the BUE, BLE  SENSORY:   normal and symmetric to light touch, temperature, vibration; DEC PP IN FEET/ANKLES  COORDINATION:   finger-nose-finger, fine finger movements normal  REFLEXES:   deep tendon reflexes TRACE and symmetric  GAIT/STATION:   narrow based gait; SLIGHT DIFF WITH TANDEM;romberg is negative    DIAGNOSTIC DATA (LABS, IMAGING, TESTING) - I reviewed patient records, labs, notes, testing and imaging myself where available.  No results found for: WBC, HGB, HCT, MCV, PLT No results found for: NA, K, CL, CO2, GLUCOSE, BUN, CREATININE, CALCIUM, PROT, ALBUMIN, AST, ALT, ALKPHOS, BILITOT, GFRNONAA, GFRAA No results found for: CHOL, HDL, LDLCALC, LDLDIRECT, TRIG, CHOLHDL No results found for: HGBA1C No results found for: VITAMINB12 No results found for: TSH   04/09/16 BLE arterial u/s  - no evidence of arterial occlusive disease.  01/26/16 BLE venous u/s  - no evidence of DVT within the right lower extremity  12/16/15 EMG/NCS - Mild sensory polyneuropathy    ASSESSMENT AND PLAN  50 y.o. year old female here with type 1 insulin-dependent  diabetes since age 68 years old, with worsening diabetes control in the last 1 year, now with evidence of diabetic peripheral neuropathy. Right lower extremity pain may represent focal diabetic neuropathy versus diabetic lumbosacral plexopathy.   Dx:  1. Diabetic polyneuropathy associated with type 1 diabetes mellitus (HCC)      PLAN: - continue gabapentin and cymbalta - trial of topical agents (capsaicin cream; will rx compounded neuropathy cream as well) - consider vitamin support (multi-vitamin, b-complex, folic acid, alpha lipoic acid) - continue to improve diabetes control with nutrition, exercise and insulin  Return in about 3 months (around 08/17/2016).    Penni Bombard, MD [id number], [id number] AM Certified in Neurology, Neurophysiology and Neuroimaging  [facility] [address] [place], [place] [address] [phone]

## 2016-05-18 NOTE — Patient Instructions (Addendum)
Thank you for coming to see [place] at [facility]. I hope we have been able to provide you high quality care today.  You may receive a patient satisfaction survey over the next few weeks. We would appreciate your feedback and comments so that we may continue to improve ourselves and the health of our patients.  - continue gabapentin and cymbalta - trial of topical compounded cream - consider vitamin support (multi-vitamin, b-complex, folic acid, alpha lipoic acid)   ~~~~~~~~~~~~~~~~~~~~~~~~~~~~~~~~~~~~~~~~~~~~~~~~~~~~~~~~~~~~~~~~~  DR. PENUMALLI'S GUIDE TO HAPPY AND HEALTHY LIVING These are some of my general health and wellness recommendations. Some of them may apply to you better than others. Please use common sense as you try these suggestions and feel free to ask me any questions.   ACTIVITY/FITNESS Mental, social, emotional and physical stimulation are very important for brain and body health. Try learning a new activity (arts, music, language, sports, games).  Keep moving your body to the best of your abilities. You can do this at home, inside or outside, the park, community center, gym or anywhere you like. Consider a physical therapist or personal trainer to get started. Consider the app Sworkit. Fitness trackers such as smart-watches, smart-phones or Fitbits can help as well.   NUTRITION Eat more plants: colorful vegetables, nuts, seeds and berries.  Eat less sugar, salt, preservatives and processed foods.  Avoid toxins such as cigarettes and alcohol.  Drink water when you are thirsty. Warm water with a slice of lemon is an excellent morning drink to start the day.  Consider these websites for more information The Nutrition Source (https://www.henry-hernandez.biz/) Precision Nutrition (WindowBlog.ch)   RELAXATION Consider practicing mindfulness meditation or other relaxation techniques such as deep breathing,  prayer, yoga, tai chi, massage. See website mindful.org or the apps Headspace or Calm to help get started.   SLEEP Try to get at least 7-8+ hours sleep per day. Regular exercise and reduced caffeine will help you sleep better. Practice good sleep hygeine techniques. See website sleep.org for more information.   PLANNING Prepare estate planning, living will, healthcare POA documents. Sometimes this is best planned with the help of an attorney. Theconversationproject.org and agingwithdignity.org are excellent resources.

## 2016-06-28 ENCOUNTER — Telehealth: Payer: Self-pay | Admitting: Diagnostic Neuroimaging

## 2016-06-28 MED ORDER — GABAPENTIN 300 MG PO CAPS: 600.0000 mg | ORAL_CAPSULE | Freq: Three times a day (TID) | ORAL | 4 refills | Status: DC

## 2016-06-28 NOTE — Telephone Encounter (Signed)
Patient requesting refill of gabapentin (NEURONTIN) 300 MG capsule Pharmacy: [facility] mail order  Pt also requesting the dosage be increased another 300mg .  Pt also would like to talk about medications in general. Pt also says she has enough medication to last until Wednesday. Call [phone]

## 2016-06-28 NOTE — Telephone Encounter (Signed)
Called patient back. She was last seen 05/18/16. She stated her pain has changed a little. Thinks increase in gabapentin would help with this. She wants to stop taking meloxicam and increase her gabapentin dose.  She is wondering if she can increase gabapentin from 1500mg  to 1800mg  total per day, only if Dr Leta Baptist is comfortable,   She states she was also on meloxicam. She only has 2 doses left. Does not think she needs this medication anymore. She was having other issues that she was taking this for at the time. It was prescribed by ortho MD.   Pt requests refill to go through mail order if applicable. She only has enough medication to get her through to Wednesday (07/04/16). She is leaving to go out of town tomorrow and maybe will be back Sunday night.   Advised if it goes through mail order, no guarentee if it will be there by Wednesday. Offered to send month supply to regular pharmacy and refills after that can go through mail order. She wants to see what Dr Leta Baptist says first and then decide. 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 Will call back by tomorrow at the latest to advise. She verbalized understanding.

## 2016-06-28 NOTE — Telephone Encounter (Signed)
rx sent in to mail order (gabapentin 600mg  three times per day). -VRP

## 2016-06-29 NOTE — Telephone Encounter (Signed)
Called patient back. Relayed Dr Leta Baptist called in rx to mail order. She verbalized understanding and stated this was okay. She is going to have husband call and check to see when it will be shipped out. If it will not be there by Wed, she will call and request rx to be sent to regular pharmacy to get her through until mail order gets her medications sent.   She also stated she forgot to mention that for the past  month or so per pt she starts itching in her stomach. She states she has been on gabapentin for a while and this started after she has been on the medication for awhile. She stated this is a new sx. Advised she should f/u with PCP. I will send message to Dr Leta Baptist and if he feels its neurological or medication related, we will call back. She verbalized understanding.

## 2016-08-06 ENCOUNTER — Telehealth: Payer: Self-pay | Admitting: Diagnostic Neuroimaging

## 2016-08-06 NOTE — Telephone Encounter (Signed)
LVM informing patient Gabapentin was refilled with [facility] on 06/28/16 with additional refills x 1 year. Advised she call [facility]. Left name, number for further questions.

## 2016-08-06 NOTE — Telephone Encounter (Signed)
Patient called to request refill of gabapentin (NEURONTIN) 300 MG capsule to [facility], only has 2 1/2 days left, requests to be sent today, does not live in [place] but will be in [place] today.

## 2016-08-29 MED ORDER — GABAPENTIN 300 MG PO CAPS: 600.0000 mg | ORAL_CAPSULE | Freq: Three times a day (TID) | ORAL | 2 refills | Status: DC

## 2016-08-29 NOTE — Telephone Encounter (Addendum)
[place], spoke with Marzetta Board to clarify how many refills patient has picked up. Erline Levine said patient picked up one in Oct and one in Nov. Advised that she discontinue the remaining refills. Patient has requested prescription be sent to [facility]. Stacy verbalized understanding.  Prescription e scribed to [facility] with 2 refills.

## 2016-08-29 NOTE — Addendum Note (Signed)
Addended by: Florian Buff C on: 08/29/2016 12:44 PM   Modules accepted: Orders

## 2016-08-29 NOTE — Telephone Encounter (Signed)
Pt called said she needs RX sent to [facility]. Advised she picked up 1 refill at [facility], she should have enough to last until mail order arrives

## 2016-08-31 ENCOUNTER — Encounter: Payer: Self-pay | Admitting: Diagnostic Neuroimaging

## 2016-08-31 ENCOUNTER — Ambulatory Visit (INDEPENDENT_AMBULATORY_CARE_PROVIDER_SITE_OTHER): Payer: Managed Care, Other (non HMO) | Admitting: Diagnostic Neuroimaging

## 2016-08-31 VITALS — BP 129/77 | HR 88 | Wt 134.4 lb

## 2016-08-31 DIAGNOSIS — E1042 Type 1 diabetes mellitus with diabetic polyneuropathy: Secondary | ICD-10-CM | POA: Diagnosis not present

## 2016-08-31 MED ORDER — GABAPENTIN 300 MG PO CAPS: 600.0000 mg | ORAL_CAPSULE | Freq: Three times a day (TID) | ORAL | 4 refills | Status: DC

## 2016-08-31 NOTE — Progress Notes (Signed)
[facility]  PATIENT: Brandi Weber DOB: 04-22-1966  REFERRING CLINICIAN: Barbette Hair HISTORY FROM: patient REASON FOR VISIT: follow up   HISTORICAL  CHIEF COMPLAINT:  Chief Complaint  Patient presents with  . Diabetic polyneuropathy assoc w/Type 1 diabetes    rm 7, "pain in feet has ramped up in past week; was doing well until that time"  . Follow-up    3 month    HISTORY OF PRESENT ILLNESS:   UPDATE 08/31/16: Since last visit, was doing well, until last few weeks. More foot pain issues. More sleep issues (busy life) and averaging only 3-4 hours per night. Tolerating gabapentin 600mg  TID.   PRIOR HPI (05/18/16): 50 year old right-handed female here for evaluation of lower extremity pain. Patient has history of insulin-dependent type 1 diabetes since age 31 years old. Patient had done well until last year when she became somewhat burnt out with her diabetes management and therefore developed poor diabetes control. Ultimately this led to diabetic ketoacidosis and hospitalization in February 2017. Hemoglobin A1c was up to 15. Following this patient started to have increasing pain in her lower extremities especially her right leg. Patient had 3 different types of pain: Bilateral toe numbness and burning, right greater than left lower extremity pain, right thigh pain. Patient is tried gabapentin without relief. Cymbalta was added which started to help symptom control. She purchased capsaicin cream but has not tried this yet. For past 8-10 years patient has had intermittent restless leg symptoms, foot cramps, especially at nighttime before going to sleep. She typically would have to stand up and walk around. Patient denies any significant low back pain. She denies low back pain radiating to legs. No problems with her hands or arms. Patient is now having better diabetes control and hemoglobin A1c is down to 10.   REVIEW OF SYSTEMS: Full 14 system review of systems performed  and negative with exception of: Swelling in legs itching feeling cold.   ALLERGIES: Allergies  Allergen Reactions  . Doxycycline     Seeing spots  . Sulfa Antibiotics     Seeing spots  . Tape     Surgical tape     HOME MEDICATIONS: Outpatient Medications Prior to Visit  Medication Sig Dispense Refill  . DULoxetine (CYMBALTA) 60 MG capsule Take 60 mg by mouth.    . gabapentin (NEURONTIN) 300 MG capsule Take 2 capsules (600 mg total) by mouth 3 (three) times daily. 540 capsule 2  . glucagon (GLUCAGON EMERGENCY) 1 MG injection As needed    . insulin glargine (LANTUS) 100 UNIT/ML injection Inject into the skin. As needed, has pump    . insulin lispro (HUMALOG) 100 UNIT/ML injection Inject into the skin. humalog per pump     No facility-administered medications prior to visit.     PAST MEDICAL HISTORY: Past Medical History:  Diagnosis Date  . Diabetes mellitus without complication (HCC)    insulin dependent  . Neuropathy ([place])    diabetic    PAST SURGICAL HISTORY: Past Surgical History:  Procedure Laterality Date  . CESAREAN SECTION     x 3  . TUBAL LIGATION  04/2007    FAMILY HISTORY: Family History  Problem Relation Age of Onset  . Cancer Maternal Grandfather     SOCIAL HISTORY:  Social History   Social History  . Marital status: Married    Spouse name: [name]  . Number of children: 4  . Years of education: 66   Occupational History  .  na   Social History Main Topics  . Smoking status: Never Smoker  . Smokeless tobacco: Never Used  . Alcohol use Yes     Comment: occasional  . Drug use: No  . Sexual activity: Not on file   Other Topics Concern  . Not on file   Social History Narrative   Lives at home with husband, 4 children   Caffeine -     PHYSICAL EXAM  GENERAL EXAM/CONSTITUTIONAL: Vitals:  Vitals:   08/31/16 0940  BP: 129/77  Pulse: 88  Weight: 134 lb 6.4 oz (61 kg)   Body mass index is 24.58 kg/m. No exam data  present  Patient is in no distress; well developed, nourished and groomed; neck is supple  CARDIOVASCULAR:  Examination of carotid arteries is normal; no carotid bruits  Regular rate and rhythm, no murmurs  Examination of peripheral vascular system by observation and palpation is normal  EYES:  Ophthalmoscopic exam of optic discs and posterior segments is normal; no papilledema or hemorrhages  MUSCULOSKELETAL:  Gait, strength, tone, movements noted in Neurologic exam below  NEUROLOGIC: MENTAL STATUS:  No flowsheet data found.  awake, alert, oriented to person, place and time  recent and remote memory intact  normal attention and concentration  language fluent, comprehension intact, naming intact,   fund of knowledge appropriate  CRANIAL NERVE:   2nd - no papilledema on fundoscopic exam  2nd, 3rd, 4th, 6th - pupils equal and reactive to light, visual fields full to confrontation, extraocular muscles intact, no nystagmus  5th - facial sensation symmetric  7th - facial strength symmetric  8th - hearing intact  9th - palate elevates symmetrically, uvula midline  11th - shoulder shrug symmetric  12th - tongue protrusion midline  MOTOR:   normal bulk and tone, full strength in the BUE, BLE  SENSORY:   normal and symmetric to light touch, temperature, vibration; DEC PP IN FEET/ANKLES  COORDINATION:   finger-nose-finger, fine finger movements normal  REFLEXES:   deep tendon reflexes TRACE and symmetric  GAIT/STATION:   narrow based gait    DIAGNOSTIC DATA (LABS, IMAGING, TESTING) - I reviewed patient records, labs, notes, testing and imaging myself where available.  No results found for: WBC, HGB, HCT, MCV, PLT No results found for: NA, K, CL, CO2, GLUCOSE, BUN, CREATININE, CALCIUM, PROT, ALBUMIN, AST, ALT, ALKPHOS, BILITOT, GFRNONAA, GFRAA No results found for: CHOL, HDL, LDLCALC, LDLDIRECT, TRIG, CHOLHDL No results found for: HGBA1C No  results found for: VITAMINB12 No results found for: TSH   04/09/16 BLE arterial u/s  - no evidence of arterial occlusive disease.  01/26/16 BLE venous u/s  - no evidence of DVT within the right lower extremity  12/16/15 EMG/NCS - Mild sensory polyneuropathy    ASSESSMENT AND PLAN  50 y.o. year old female here with type 1 insulin-dependent diabetes since age 42 years old, with worsening diabetes control in the last 1 year, now with evidence of diabetic peripheral neuropathy. Right lower extremity pain may represent focal diabetic neuropathy versus diabetic lumbosacral plexopathy.  Worsening pain lately, may be related to lack of sleep and long car rides.   Dx:  1. Diabetic polyneuropathy associated with type 1 diabetes mellitus (HCC)      PLAN: - continue gabapentin (300-900mg  three times per day) - continue cymbalta 60mg  daily - continue topical agents (capsaicin cream; will rx compounded neuropathy cream as well) - consider vitamin support (multi-vitamin, b-complex, folic acid, alpha lipoic acid) - continue to improve diabetes control  with nutrition, exercise and insulin - continue to improve sleep hygiene  Meds ordered this encounter  Medications  . gabapentin (NEURONTIN) 300 MG capsule    Sig: Take 2 capsules (600 mg total) by mouth 3 (three) times daily.    Dispense:  540 capsule    Refill:  4   Return in about 6 months (around 03/01/2017).    Penni Bombard, MD [id number], [id number] AM Certified in Neurology, Neurophysiology and Neuroimaging  [facility] [address], [place], [place] [address] [phone]

## 2016-12-04 ENCOUNTER — Telehealth: Payer: Self-pay | Admitting: Diagnostic Neuroimaging

## 2016-12-04 NOTE — Telephone Encounter (Signed)
Patient called office after speaking with nurse requesting an appointment to be seen.  I offered patient to see Dr. Leta Baptist 12/11/16 per patient she was [place].  I offered patient to see NP this week with a couple of opens opened at the time, per patient she will speak with her husband and call our office back if she is wanting to move forward with scheduling an appointment this week.

## 2016-12-04 NOTE — Telephone Encounter (Signed)
LVM requesting call back to discuss her phone call this morning.

## 2016-12-04 NOTE — Telephone Encounter (Signed)
Agree with plan for patient to take higher dose of gabapentin only after returning home from driving in the the evening. Also, patient should not be driving if she is sleep deprived, taking gabapentin and feeling drowsy. -VRP
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
## 2016-12-04 NOTE — Telephone Encounter (Signed)
Pt called to saying she is having extreme sleepiness when driving and she feels it is because of medication. She is concerned because of the driving that she needs to do for her 4 children.

## 2016-12-05 ENCOUNTER — Telehealth: Payer: Self-pay | Admitting: Diagnostic Neuroimaging

## 2016-12-05 DIAGNOSIS — G4719 Other hypersomnia: Secondary | ICD-10-CM

## 2016-12-05 NOTE — Telephone Encounter (Signed)
Patient called back, call transferred to Dr Leta Baptist to discuss her concerns.
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
## 2016-12-05 NOTE — Telephone Encounter (Signed)
Noted. I called pt. -VRP

## 2016-12-05 NOTE — Telephone Encounter (Signed)
LVM requesting call back to discuss previous call.

## 2016-12-05 NOTE — Telephone Encounter (Signed)
Pt called back said she needs to be seen for sleep issues.  Rn was skpyed and she advised she will call the patient back this afternoon

## 2017-01-03 ENCOUNTER — Encounter (INDEPENDENT_AMBULATORY_CARE_PROVIDER_SITE_OTHER): Payer: Self-pay

## 2017-01-03 ENCOUNTER — Encounter: Payer: Self-pay | Admitting: Neurology

## 2017-01-03 ENCOUNTER — Ambulatory Visit (INDEPENDENT_AMBULATORY_CARE_PROVIDER_SITE_OTHER): Payer: Managed Care, Other (non HMO) | Admitting: Neurology

## 2017-01-03 VITALS — BP 108/72 | HR 97 | Ht 62.0 in | Wt 140.0 lb

## 2017-01-03 DIAGNOSIS — R400 Somnolence: Secondary | ICD-10-CM

## 2017-01-03 DIAGNOSIS — G2581 Restless legs syndrome: Secondary | ICD-10-CM

## 2017-01-03 DIAGNOSIS — E1065 Type 1 diabetes mellitus with hyperglycemia: Secondary | ICD-10-CM | POA: Diagnosis not present

## 2017-01-03 DIAGNOSIS — E1043 Type 1 diabetes mellitus with diabetic autonomic (poly)neuropathy: Secondary | ICD-10-CM | POA: Diagnosis not present

## 2017-01-03 DIAGNOSIS — G4761 Periodic limb movement disorder: Secondary | ICD-10-CM | POA: Diagnosis not present

## 2017-01-03 DIAGNOSIS — G4750 Parasomnia, unspecified: Secondary | ICD-10-CM | POA: Diagnosis not present
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
## 2017-01-03 NOTE — Patient Instructions (Signed)
We will look into your severe sleepiness with a nighttime sleep study.  Do not drive when this sleepy.  Do not stay or work at heights, or operate dangerous or heavy equipment when tired or sleepy. Please remember to try to maintain good sleep hygiene, which means: Keep a regular sleep and wake schedule, try not to exercise or have a meal within 2 hours of your bedtime, try to keep your bedroom conducive for sleep, that is, cool and dark, without light distractors such as an illuminated alarm clock, and refrain from watching TV right before sleep or in the middle of the night and do not keep the TV or radio on during the night. Also, try not to use or play on electronic devices at bedtime, such as your cell phone, tablet PC or laptop. If you like to read at bedtime on an electronic device, try to dim the background light as much as possible. Do not eat in the middle of the night.

## 2017-01-14 ENCOUNTER — Telehealth: Payer: Self-pay

## 2017-01-14 NOTE — Telephone Encounter (Signed)
Contacted patient on 01-14-17 @ 10:40am to schedule Split night sleep study ordered by Dr. Rexene Alberts.  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 Patient stated that she will call us back to schedule when convenient for them.  Pt was given sleep lab number and to ask for Dawn. She was also told the nights of week we are open for her to schedule.

## 2017-01-24 ENCOUNTER — Encounter: Payer: Self-pay | Admitting: Diagnostic Neuroimaging

## 2017-03-08 ENCOUNTER — Ambulatory Visit: Payer: Managed Care, Other (non HMO) | Admitting: Diagnostic Neuroimaging

## 2017-04-30 ENCOUNTER — Telehealth: Payer: Self-pay | Admitting: Diagnostic Neuroimaging

## 2017-04-30 NOTE — Telephone Encounter (Signed)
Pt calling to confirm if she was on the wait list, confirmed that she is.  Pt is also asking this message be sent to RN to inform that outside of the Diabetic Neuropathy in her feet which is under control her hands are falling asleep and she is concerned due to doing a lot of driving. Pt is asking to be called if there is a way to be seen before 9-11

## 2017-04-30 NOTE — Telephone Encounter (Signed)
**  VP's 2pm appt. today has been taken by another pt.  If pt. calls back, please let her know this appt. is no longer available/fim

## 2017-04-30 NOTE — Telephone Encounter (Signed)
LMOM (identified vm) that VP has had a cancellation at 2pm today.  She may call back if she would like to see if this appt. is still available/fim

## 2017-05-28 ENCOUNTER — Ambulatory Visit (INDEPENDENT_AMBULATORY_CARE_PROVIDER_SITE_OTHER): Payer: Managed Care, Other (non HMO) | Admitting: Diagnostic Neuroimaging

## 2017-05-28 ENCOUNTER — Encounter: Payer: Self-pay | Admitting: Diagnostic Neuroimaging

## 2017-05-28 VITALS — BP 119/75 | HR 91 | Ht 62.0 in | Wt 153.8 lb

## 2017-05-28 DIAGNOSIS — E1042 Type 1 diabetes mellitus with diabetic polyneuropathy: Secondary | ICD-10-CM | POA: Diagnosis not present

## 2017-05-28 MED ORDER — GABAPENTIN (ONCE-DAILY) 600 MG PO TABS: 1800.0000 mg | ORAL_TABLET | Freq: Every evening | ORAL | 4 refills | Status: DC

## 2017-05-28 NOTE — Progress Notes (Signed)
[facility]  PATIENT: Brandi Weber DOB: 08-14-1966  REFERRING CLINICIAN: Barbette Hair HISTORY FROM: patient REASON FOR VISIT: follow up   HISTORICAL  CHIEF COMPLAINT:  Chief Complaint  Patient presents with  . Follow-up  . Peripheral Neuropathy/RLS    taking gabapentin (five times daily) .  (due to her schedule).   Cream helps (uses at bedtime).   (not really conducive to daytime application due to her schedule).  bill hand numbness, with breakthru feet pain.    HISTORY OF PRESENT ILLNESS:   UPDATE (05/28/17, VRP): Since last visit, doing better. Tolerating gabapentin and compounded cream. No alleviating or aggravating factors. Sleepiness is   UPDATE 08/31/16: Since last visit, was doing well, until last few weeks. More foot pain issues. More sleep issues (busy life) and averaging only 3-4 hours per night. Tolerating gabapentin 600mg  TID.   PRIOR HPI (05/18/16): 51 year old right-handed female here for evaluation of lower extremity pain. Patient has history of insulin-dependent type 1 diabetes since age 54 years old. Patient had done well until last year when she became somewhat burnt out with her diabetes management and therefore developed poor diabetes control. Ultimately this led to diabetic ketoacidosis and hospitalization in February 2017. Hemoglobin A1c was up to 15. Following this patient started to have increasing pain in her lower extremities especially her right leg. Patient had 3 different types of pain: Bilateral toe numbness and burning, right greater than left lower extremity pain, right thigh pain. Patient is tried gabapentin without relief. Cymbalta was added which started to help symptom control. She purchased capsaicin cream but has not tried this yet. For past 8-10 years patient has had intermittent restless leg symptoms, foot cramps, especially at nighttime before going to sleep. She typically would have to stand up and walk around. Patient denies any  significant low back pain. She denies low back pain radiating to legs. No problems with her hands or arms. Patient is now having better diabetes control and hemoglobin A1c is down to 10.   REVIEW OF SYSTEMS: Full 14 system review of systems performed and negative with exception of: Swelling in legs itching feeling cold.   ALLERGIES: Allergies  Allergen Reactions  . Doxycycline     Seeing spots  . Sulfa Antibiotics     Seeing spots  . Tape     Surgical tape     HOME MEDICATIONS: Outpatient Medications Prior to Visit  Medication Sig Dispense Refill  . DULoxetine (CYMBALTA) 60 MG capsule Take 60 mg by mouth.    . gabapentin (NEURONTIN) 300 MG capsule Take 2 capsules (600 mg total) by mouth 3 (three) times daily. 540 capsule 4  . glucagon (GLUCAGON EMERGENCY) 1 MG injection As needed    . insulin glargine (LANTUS) 100 UNIT/ML injection Inject into the skin. As needed, has pump    . insulin lispro (HUMALOG) 100 UNIT/ML injection Inject into the skin. humalog per pump     No facility-administered medications prior to visit.     PAST MEDICAL HISTORY: Past Medical History:  Diagnosis Date  . Diabetes mellitus without complication (HCC)    insulin dependent  . Neuropathy    diabetic    PAST SURGICAL HISTORY: Past Surgical History:  Procedure Laterality Date  . CESAREAN SECTION     x 3  . TUBAL LIGATION  04/2007    FAMILY HISTORY: Family History  Problem Relation Age of Onset  . Cancer Maternal Grandfather     SOCIAL HISTORY:  Social History  Social History  . Marital status: Married    Spouse name: [name]  . Number of children: 4  . Years of education: 1   Occupational History  .      na   Social History Main Topics  . Smoking status: Never Smoker  . Smokeless tobacco: Never Used  . Alcohol use Yes     Comment: occasional  . Drug use: No  . Sexual activity: Not on file   Other Topics Concern  . Not on file   Social History Narrative   Lives at  home with husband, 4 children   Caffeine -     PHYSICAL EXAM  GENERAL EXAM/CONSTITUTIONAL: Vitals:  Vitals:   05/28/17 0948  BP: 119/75  Pulse: 91  Weight: 153 lb 12.8 oz (69.8 kg)  Height: 5\' 2"  (1.575 m)   Body mass index is 28.13 kg/m. No exam data present  Patient is in no distress; well developed, nourished and groomed; neck is supple  CARDIOVASCULAR:  Examination of carotid arteries is normal; no carotid bruits  Regular rate and rhythm, no murmurs  Examination of peripheral vascular system by observation and palpation is normal  EYES:  Ophthalmoscopic exam of optic discs and posterior segments is normal; no papilledema or hemorrhages  MUSCULOSKELETAL:  Gait, strength, tone, movements noted in Neurologic exam below  NEUROLOGIC: MENTAL STATUS:  No flowsheet data found.  awake, alert, oriented to person, place and time  recent and remote memory intact  normal attention and concentration  language fluent, comprehension intact, naming intact,   fund of knowledge appropriate  CRANIAL NERVE:   2nd - no papilledema on fundoscopic exam  2nd, 3rd, 4th, 6th - pupils equal and reactive to light, visual fields full to confrontation, extraocular muscles intact, no nystagmus  5th - facial sensation symmetric  7th - facial strength symmetric  8th - hearing intact  9th - palate elevates symmetrically, uvula midline  11th - shoulder shrug symmetric  12th - tongue protrusion midline  MOTOR:   normal bulk and tone, full strength in the BUE, BLE  SENSORY:   normal and symmetric to light touch, temperature, vibration; DEC PP IN FEET/ANKLES  COORDINATION:   finger-nose-finger, fine finger movements normal  REFLEXES:   deep tendon reflexes TRACE and symmetric  GAIT/STATION:   narrow based gait    DIAGNOSTIC DATA (LABS, IMAGING, TESTING) - I reviewed patient records, labs, notes, testing and imaging myself where available.  No results found  for: WBC, HGB, HCT, MCV, PLT No results found for: NA, K, CL, CO2, GLUCOSE, BUN, CREATININE, CALCIUM, PROT, ALBUMIN, AST, ALT, ALKPHOS, BILITOT, GFRNONAA, GFRAA No results found for: CHOL, HDL, LDLCALC, LDLDIRECT, TRIG, CHOLHDL No results found for: HGBA1C No results found for: VITAMINB12 No results found for: TSH   04/09/16 BLE arterial u/s  - no evidence of arterial occlusive disease.  01/26/16 BLE venous u/s  - no evidence of DVT within the right lower extremity  12/16/15 EMG/NCS - Mild sensory polyneuropathy    ASSESSMENT AND PLAN  51 y.o. year old female here with type 1 insulin-dependent diabetes since age 28 years old, with worsening diabetes control in the last 1 year, now with evidence of diabetic peripheral neuropathy. Right lower extremity pain may represent focal diabetic neuropathy versus diabetic lumbosacral plexopathy.  Meds tried: gabapentin, cymbalta  Dx:  1. Diabetic polyneuropathy associated with type 1 diabetes mellitus (HCC)      PLAN:  I spent 25 minutes of face to face time with patient.  Greater than 50% of time was spent in counseling and coordination of care with patient. In summary we discussed:   DIABETIC NEUROPATHY (established problem, stable) - continue gabapentin (currently 600mg  at 6am, 300mg  at 10am, 300mg  at 1pm, 300 at 6pm, 300mg  at 10pm) - total 1800mg  daily; will try switching over to gralise (extended release gabapentin) 1800mg  at dinner time to help reduce side effects - continue cymbalta 60mg  daily - continue topical agents (neuropathy cream as well) - consider vitamin support (multi-vitamin, b-complex, folic acid, alpha lipoic acid)  DIABETES CONTROL (established problem, improved) - continue to improve diabetes control with nutrition, exercise and insulin  INSOMNIA / SLEEP DEPRIVATION (established problem, improved) - continue to improve sleep hygiene  Meds ordered this encounter  Medications  . Gabapentin, Once-Daily, (GRALISE)  600 MG TABS    Sig: Take 1,800 mg by mouth every evening. With dinner    Dispense:  270 tablet    Refill:  4   Return in about 6 months (around 11/25/2017).    Penni Bombard, MD 9/74/1638, 45:36 AM Certified in Neurology, Neurophysiology and Neuroimaging  [facility] [address] [place], [place] [address] [phone]

## 2017-05-28 NOTE — Patient Instructions (Addendum)
-   will try switching over to gralise (extended release gabapentin) 1800mg  at dinner time to help reduce side effects

## 2017-06-03 ENCOUNTER — Telehealth: Payer: Self-pay | Admitting: Diagnostic Neuroimaging

## 2017-06-03 MED ORDER — GABAPENTIN 300 MG PO CAPS: 600.0000 mg | ORAL_CAPSULE | Freq: Three times a day (TID) | ORAL | 0 refills | Status: DC

## 2017-06-03 NOTE — Telephone Encounter (Addendum)
Spoke to Jenny Reichmann, R at [employer] and I asked him about next day delivery, he stated that already sent out as a 5-10 day delivery, could not overnight.    They did not need anymore information.  I LMVM for pt relating to this and can send in 5-10 days of gabapentin, for her to let me know. Went ahead and put in prescription for gabapentin 600mg  po tid # 60 with no refill at [facility].

## 2017-06-03 NOTE — Telephone Encounter (Signed)
Pt's husband called back he said these medications will need to be shipped over night so he is requesting a call back asap please. Thank you

## 2017-06-03 NOTE — Telephone Encounter (Signed)
Patient called office in reference to Gabapentin, Once-Daily, (GRALISE) 600 MG TABS patient states [facility] is needing directions of how patient should do titration for the medication.  Also patient only has 3 Gabapentin 300mg  not 6 to take today.  [facility] [phone].  Please call

## 2017-06-03 NOTE — Addendum Note (Signed)
Addended by: Oliver Hum S on: 06/03/2017 11:41 AM   Modules accepted: Orders

## 2017-11-22 ENCOUNTER — Ambulatory Visit: Payer: Managed Care, Other (non HMO) | Admitting: Diagnostic Neuroimaging

## 2017-12-10 ENCOUNTER — Telehealth: Payer: Self-pay | Admitting: Neurology

## 2017-12-10 NOTE — Telephone Encounter (Signed)
I called the patient. She has had a change in her condition in the last 24 hours, she feels cold all over, and she is achy all over, and she is having some increased numbness in the feet and hands. I am concerned that the symptoms are not related to her peripheral neuropathy, she may be developing a viral illness. She is not running any fevers.  I indicated that she will need a medical evaluation. She indicates that her blood sugars are OK.  She is to see her primary physician for an evaluation.

## 2017-12-11 NOTE — Telephone Encounter (Signed)
Dr. Jannifer Franklin, thank you for updating me. I agree with plan.  I will have [place] follow up with patient by phone in a few days.   -VRP

## 2017-12-12 NOTE — Telephone Encounter (Signed)
I spoke to pt and she is been to see her pcp Dr. Deforest Hoyles yesterday for her sx on Monday. She had lab, UA - negative, Flu swab - negative.  She had fever last night 102.  Is afebrile now.  She has swelling in her foot that had broken toe.  (she relates that her DPN was pain free for the last year except for every once in a while she would have breakthru pain.  She wonders if viral illness can increase neuropathy sx ?  Has appt 4/10-19 with Dr. Leta Baptist.

## 2017-12-13 ENCOUNTER — Ambulatory Visit
Admission: RE | Admit: 2017-12-13 | Discharge: 2017-12-13 | Disposition: A | Discharge status: Home / Self Care | Payer: Managed Care, Other (non HMO) | Source: Ambulatory Visit | Attending: Internal Medicine | Admitting: Internal Medicine

## 2017-12-13 ENCOUNTER — Other Ambulatory Visit: Payer: Self-pay | Admitting: Internal Medicine

## 2017-12-13 DIAGNOSIS — R6889 Other general symptoms and signs: Secondary | ICD-10-CM

## 2017-12-13 DIAGNOSIS — R059 Cough, unspecified: Secondary | ICD-10-CM

## 2017-12-13 DIAGNOSIS — R52 Pain, unspecified: Secondary | ICD-10-CM

## 2017-12-13 DIAGNOSIS — R05 Cough: Secondary | ICD-10-CM

## 2017-12-16 DIAGNOSIS — J189 Pneumonia, unspecified organism: Secondary | ICD-10-CM

## 2017-12-16 HISTORY — DX: Pneumonia, unspecified organism: J18.9

## 2017-12-25 ENCOUNTER — Ambulatory Visit: Payer: Managed Care, Other (non HMO) | Admitting: Diagnostic Neuroimaging

## 2017-12-25 ENCOUNTER — Telehealth: Payer: Self-pay | Admitting: Diagnostic Neuroimaging

## 2017-12-25 ENCOUNTER — Encounter: Payer: Self-pay | Admitting: Diagnostic Neuroimaging

## 2017-12-25 VITALS — BP 119/69 | HR 83 | Ht 62.0 in | Wt 155.0 lb

## 2017-12-25 DIAGNOSIS — E1042 Type 1 diabetes mellitus with diabetic polyneuropathy: Secondary | ICD-10-CM

## 2017-12-25 DIAGNOSIS — G4700 Insomnia, unspecified: Secondary | ICD-10-CM

## 2017-12-25 MED ORDER — DULOXETINE HCL 60 MG PO CPEP: 60.0000 mg | ORAL_CAPSULE | Freq: Every day | ORAL | 4 refills | Status: DC

## 2017-12-25 MED ORDER — GABAPENTIN (ONCE-DAILY) 600 MG PO TABS: 1800.0000 mg | ORAL_TABLET | Freq: Every evening | ORAL | 4 refills | Status: DC

## 2017-12-25 NOTE — Progress Notes (Signed)
[facility]  PATIENT: Brandi Weber DOB: 1966-01-06  REFERRING CLINICIAN: Barbette Hair HISTORY FROM: patient REASON FOR VISIT: follow up   HISTORICAL  CHIEF COMPLAINT:  Chief Complaint  Patient presents with  . Diabetic polyneuropathy    rm 7, husband- [name], "neuropathy pretty much just in feet, pretty much under control but nightly"  . Follow-up    6 month    HISTORY OF PRESENT ILLNESS:   UPDATE (12/25/17, VRP): Since last visit, had fevers, chills, neuropathy pain a few weeks ago, dx'd with pneumonia. Now s/p antibiotics. Tolerating meds. No alleviating or aggravating factors. Gralise is better for sedation. Still with insomnia. Also with gradual weight gain. Also with decreased libido.   UPDATE (05/28/17, VRP): Since last visit, doing better. Tolerating gabapentin and compounded cream. No alleviating or aggravating factors.   UPDATE 08/31/16: Since last visit, was doing well, until last few weeks. More foot pain issues. More sleep issues (busy life) and averaging only 3-4 hours per night. Tolerating gabapentin 600mg  TID.   PRIOR HPI (05/18/16): 52 year old right-handed female here for evaluation of lower extremity pain. Patient has history of insulin-dependent type 1 diabetes since age 52 years old. Patient had done well until last year when she became somewhat burnt out with her diabetes management and therefore developed poor diabetes control. Ultimately this led to diabetic ketoacidosis and hospitalization in February 2017. Hemoglobin A1c was up to 15. Following this patient started to have increasing pain in her lower extremities especially her right leg. Patient had 3 different types of pain: Bilateral toe numbness and burning, right greater than left lower extremity pain, right thigh pain. Patient is tried gabapentin without relief. Cymbalta was added which started to help symptom control. She purchased capsaicin cream but has not tried this yet. For past 8-10  years patient has had intermittent restless leg symptoms, foot cramps, especially at nighttime before going to sleep. She typically would have to stand up and walk around. Patient denies any significant low back pain. She denies low back pain radiating to legs. No problems with her hands or arms. Patient is now having better diabetes control and hemoglobin A1c is down to 10.   REVIEW OF SYSTEMS: Full 14 system review of systems performed and negative with exception of: Swelling in legs itching feeling cold.   ALLERGIES: Allergies  Allergen Reactions  . Doxycycline     Seeing spots  . Sulfa Antibiotics     Seeing spots  . Tape     Surgical tape     HOME MEDICATIONS: Outpatient Medications Prior to Visit  Medication Sig Dispense Refill  . DULoxetine (CYMBALTA) 60 MG capsule Take 60 mg by mouth.    . Gabapentin, Once-Daily, (GRALISE) 600 MG TABS Take 1,800 mg by mouth every evening. With dinner 270 tablet 4  . glucagon (GLUCAGON EMERGENCY) 1 MG injection As needed    . insulin glargine (LANTUS) 100 UNIT/ML injection Inject into the skin. As needed, has pump    . insulin lispro (HUMALOG) 100 UNIT/ML injection Inject into the skin. humalog per pump    . NONFORMULARY OR COMPOUNDED ITEM Topical cream for neuropathy  (aspirar)     No facility-administered medications prior to visit.     PAST MEDICAL HISTORY: Past Medical History:  Diagnosis Date  . Diabetes mellitus without complication (HCC)    insulin dependent  . Neuropathy    diabetic  . Pneumonia 12/2017    PAST SURGICAL HISTORY: Past Surgical History:  Procedure Laterality Date  .  CESAREAN SECTION     x 3  . TUBAL LIGATION  04/2007    FAMILY HISTORY: Family History  Problem Relation Age of Onset  . Cancer Maternal Grandfather     SOCIAL HISTORY:  Social History   Socioeconomic History  . Marital status: Married    Spouse name: [name]  . Number of children: 4  . Years of education: 71  . Highest education  level: Not on file  Occupational History    Comment: na  Social Needs  . Financial resource strain: Not on file  . Food insecurity:    Worry: Not on file    Inability: Not on file  . Transportation needs:    Medical: Not on file    Non-medical: Not on file  Tobacco Use  . Smoking status: Never Smoker  . Smokeless tobacco: Never Used  Substance and Sexual Activity  . Alcohol use: Yes    Comment: occasional  . Drug use: No  . Sexual activity: Not on file  Lifestyle  . Physical activity:    Days per week: Not on file    Minutes per session: Not on file  . Stress: Not on file  Relationships  . Social connections:    Talks on phone: Not on file    Gets together: Not on file    Attends religious service: Not on file    Active member of club or organization: Not on file    Attends meetings of clubs or organizations: Not on file    Relationship status: Not on file  . Intimate partner violence:    Fear of current or ex partner: Not on file    Emotionally abused: Not on file    Physically abused: Not on file    Forced sexual activity: Not on file  Other Topics Concern  . Not on file  Social History Narrative   Lives at home with husband, 4 children   Caffeine -     PHYSICAL EXAM  GENERAL EXAM/CONSTITUTIONAL: Vitals:  Vitals:   12/25/17 1437  BP: 119/69  Pulse: 83  Weight: 155 lb (70.3 kg)  Height: 5\' 2"  (1.575 m)   Wt Readings from Last 3 Encounters:  12/25/17 155 lb (70.3 kg)  05/28/17 153 lb 12.8 oz (69.8 kg)  01/03/17 140 lb (63.5 kg)   Body mass index is 28.35 kg/m. No exam data present  Patient is in no distress; well developed, nourished and groomed; neck is supple  CARDIOVASCULAR:  Examination of carotid arteries is normal; no carotid bruits  Regular rate and rhythm, no murmurs  Examination of peripheral vascular system by observation and palpation is normal  EYES:  Ophthalmoscopic exam of optic discs and posterior segments is normal; no  papilledema or hemorrhages  MUSCULOSKELETAL:  Gait, strength, tone, movements noted in Neurologic exam below  NEUROLOGIC: MENTAL STATUS:  No flowsheet data found.  awake, alert, oriented to person, place and time  recent and remote memory intact  normal attention and concentration  language fluent, comprehension intact, naming intact,   fund of knowledge appropriate  CRANIAL NERVE:   2nd - no papilledema on fundoscopic exam  2nd, 3rd, 4th, 6th - pupils equal and reactive to light, visual fields full to confrontation, extraocular muscles intact, no nystagmus  5th - facial sensation symmetric  7th - facial strength symmetric  8th - hearing intact  9th - palate elevates symmetrically, uvula midline  11th - shoulder shrug symmetric  12th - tongue protrusion midline  MOTOR:  normal bulk and tone, full strength in the BUE, BLE  SENSORY:   normal and symmetric to light touch, temperature, vibration; DEC PP IN FEET/ANKLES  COORDINATION:   finger-nose-finger, fine finger movements normal  REFLEXES:   deep tendon reflexes TRACE and symmetric  GAIT/STATION:   narrow based gait    DIAGNOSTIC DATA (LABS, IMAGING, TESTING) - I reviewed patient records, labs, notes, testing and imaging myself where available.  No results found for: WBC, HGB, HCT, MCV, PLT No results found for: NA, K, CL, CO2, GLUCOSE, BUN, CREATININE, CALCIUM, PROT, ALBUMIN, AST, ALT, ALKPHOS, BILITOT, GFRNONAA, GFRAA No results found for: CHOL, HDL, LDLCALC, LDLDIRECT, TRIG, CHOLHDL No results found for: HGBA1C No results found for: VITAMINB12 No results found for: TSH   04/09/16 BLE arterial u/s  - no evidence of arterial occlusive disease.  01/26/16 BLE venous u/s  - no evidence of DVT within the right lower extremity  12/16/15 EMG/NCS - Mild sensory polyneuropathy    ASSESSMENT AND PLAN  52 y.o. year old female here with type 1 insulin-dependent diabetes since age 46 years old,  with worsening diabetes control in the last 1 year, now with evidence of diabetic peripheral neuropathy. Right lower extremity pain may represent focal diabetic neuropathy versus diabetic lumbosacral plexopathy.  Meds tried: gabapentin, cymbalta  Dx:  1. Diabetic polyneuropathy associated with type 1 diabetes mellitus ([place])   2. Insomnia, unspecified type      PLAN:  I spent 25 minutes of face to face time with patient. Greater than 50% of time was spent in counseling and coordination of care with patient. In summary we discussed:   DIABETIC NEUROPATHY - continue gralise (extended release gabapentin) 1800mg  at dinner time - continue cymbalta 60mg  daily - continue topical agents (neuropathy cream) - consider vitamin support (multi-vitamin, b-complex, folic acid, alpha lipoic acid)  DIABETES CONTROL - continue to improve diabetes control with nutrition, exercise and insulin  INSOMNIA / SLEEP DEPRIVATION - continue to improve sleep hygiene - consider psychology evaluation  Meds ordered this encounter  Medications  . Gabapentin, Once-Daily, (GRALISE) 600 MG TABS    Sig: Take 1,800 mg by mouth every evening. With dinner    Dispense:  270 tablet    Refill:  4  . DULoxetine (CYMBALTA) 60 MG capsule    Sig: Take 1 capsule (60 mg total) by mouth daily.    Dispense:  90 capsule    Refill:  4   Return in about 6 months (around 06/26/2018).    Penni Bombard, MD 03/10/7627, 3:15 PM Certified in Neurology, Neurophysiology and Neuroimaging  [facility] [address] [place], [place] [address] [phone]

## 2017-12-25 NOTE — Progress Notes (Signed)
Medical certification & renewal of disability parking placard form filled out, signed by Dr Leta Baptist, mailed to patient as she requested.

## 2017-12-25 NOTE — Patient Instructions (Signed)
DIABETIC NEUROPATHY - continue gralise (extended release gabapentin) 1800mg  at dinner time - continue cymbalta 60mg  daily - continue topical agents (neuropathy cream) - consider vitamin support (multi-vitamin, b-complex, folic acid, alpha lipoic acid)  DIABETES CONTROL - continue to improve diabetes control with nutrition, exercise and insulin  INSOMNIA / SLEEP DEPRIVATION - continue to improve sleep hygiene - consider psychology evaluation

## 2017-12-25 NOTE — Telephone Encounter (Signed)
Pt was seen on 4/10 and needed a 1 yr follow up. Pt refused to schedule the follow up at check out. I advised pt to make sure she calls well in advance to make an appt.

## 2018-03-13 ENCOUNTER — Encounter: Payer: Self-pay | Admitting: *Deleted

## 2018-03-13 ENCOUNTER — Telehealth: Payer: Self-pay | Admitting: Diagnostic Neuroimaging

## 2018-03-13 NOTE — Telephone Encounter (Signed)
Pt is asking for a Travel Letter as it relates to her 3 topical gel medications that she must have with her.  Pt going out of the country ([place], [place]) and this letter is for that purpose.  Pt is leaving on Monday July 1st.  Please call

## 2018-03-13 NOTE — Telephone Encounter (Signed)
Letter on Dr [employer] desk for signature.

## 2018-03-13 NOTE — Telephone Encounter (Signed)
Letter signed, ready at front desk for pick up. LVM informing patient letter is ready and reminded her of office hours tomorrow 8 am - 12 noon.

## 2018-03-13 NOTE — Telephone Encounter (Signed)
Pt called back. The medications are  lidocaine 5% ointment, doxepin 5% cream, diclofenac sodium 3% gel. Pt states she will need to pick the letter up prior to the clinic closing at noon tomorrow. Please call when letter is ready

## 2018-03-13 NOTE — Telephone Encounter (Addendum)
Called patient to discuss letter for traveling with medications.  She stated that she will be traveling on a [place] holiday and was told airport security will be heightened. She will be carrying the 3 gels on her person. She stated that she was unable to get them compounded as listed on her medication list in Epic. She stated she would rather have a letter in the event she may need one. She will call back with specific names of medications and possibly a fax number to fax the letter to her.  If unable to fax, she will pick up the letter tomorrow. She is aware this office closes at noon tomorrow.  She will give phone staff the information. She verbalized understanding, appreciation for call back.

## 2018-08-07 ENCOUNTER — Telehealth: Payer: Self-pay | Admitting: Diagnostic Neuroimaging

## 2018-08-07 NOTE — Telephone Encounter (Signed)
I called and LMVM for pt that her duloxetine was mailed out yesterday [facility] and another refill is on that prescription.  (was done for a yr 12/2017 per Dr. Leta Baptist).  Pt to call back if questions.

## 2018-08-07 NOTE — Telephone Encounter (Signed)
Pt has scheduled her 6 month f/u for 1st Wed available 01-07-2019 pt is asking that her  DULoxetine (CYMBALTA) 60 MG capsule [place], [place] - [address] [phone] (Phone) [phone] (Fax)

## 2018-09-17 DIAGNOSIS — C9110 Chronic lymphocytic leukemia of B-cell type not having achieved remission: Secondary | ICD-10-CM

## 2018-09-17 HISTORY — DX: Chronic lymphocytic leukemia of B-cell type not having achieved remission: C91.10

## 2018-10-20 IMAGING — CR DG CHEST 2V
2 series · 2 of 2 positions shown · non-contrast
Comparison: 10/22/2015

CLINICAL DATA: Productive cough and fever 5 days.

EXAM:
CHEST - 2 VIEW

[w chest pa]
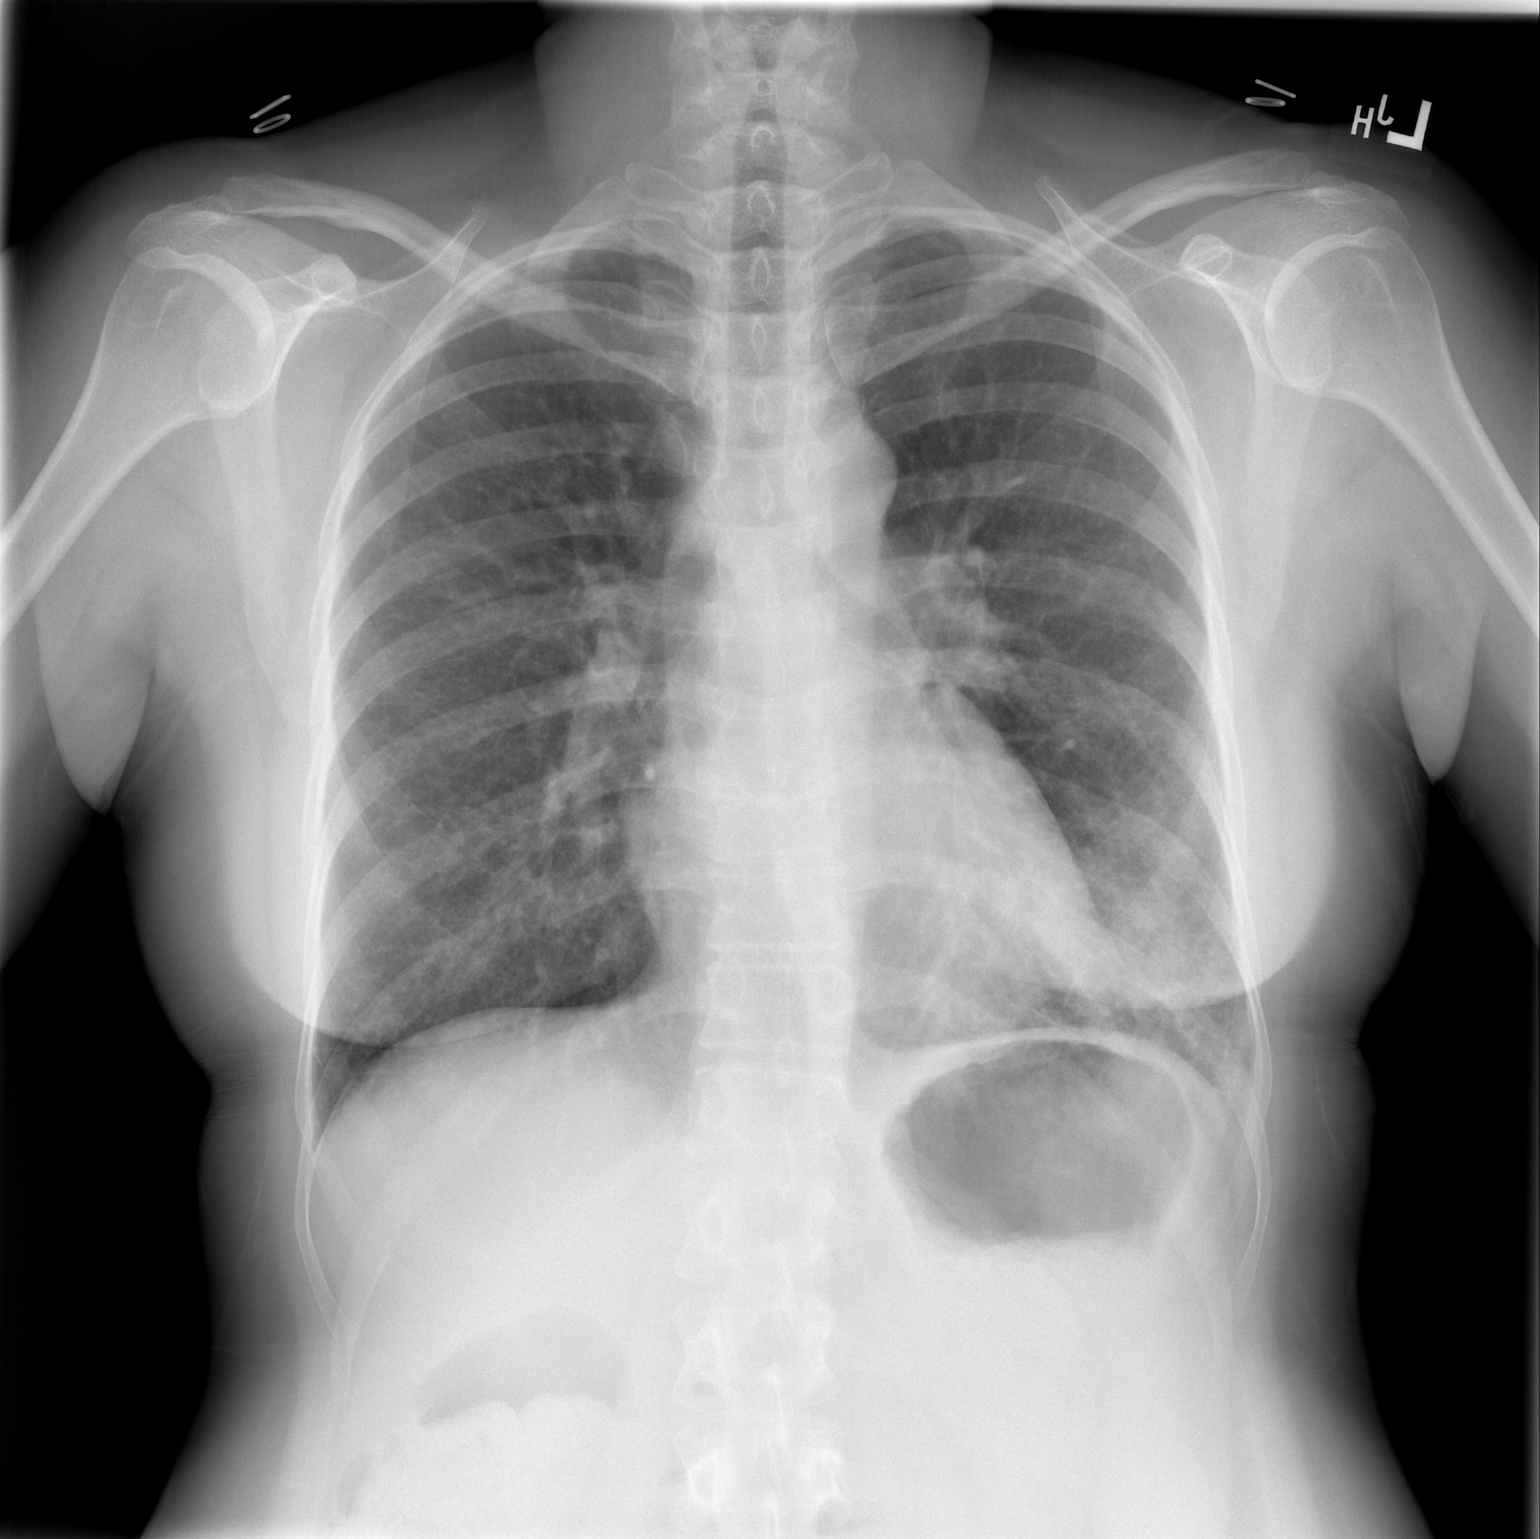

[w chest lat]
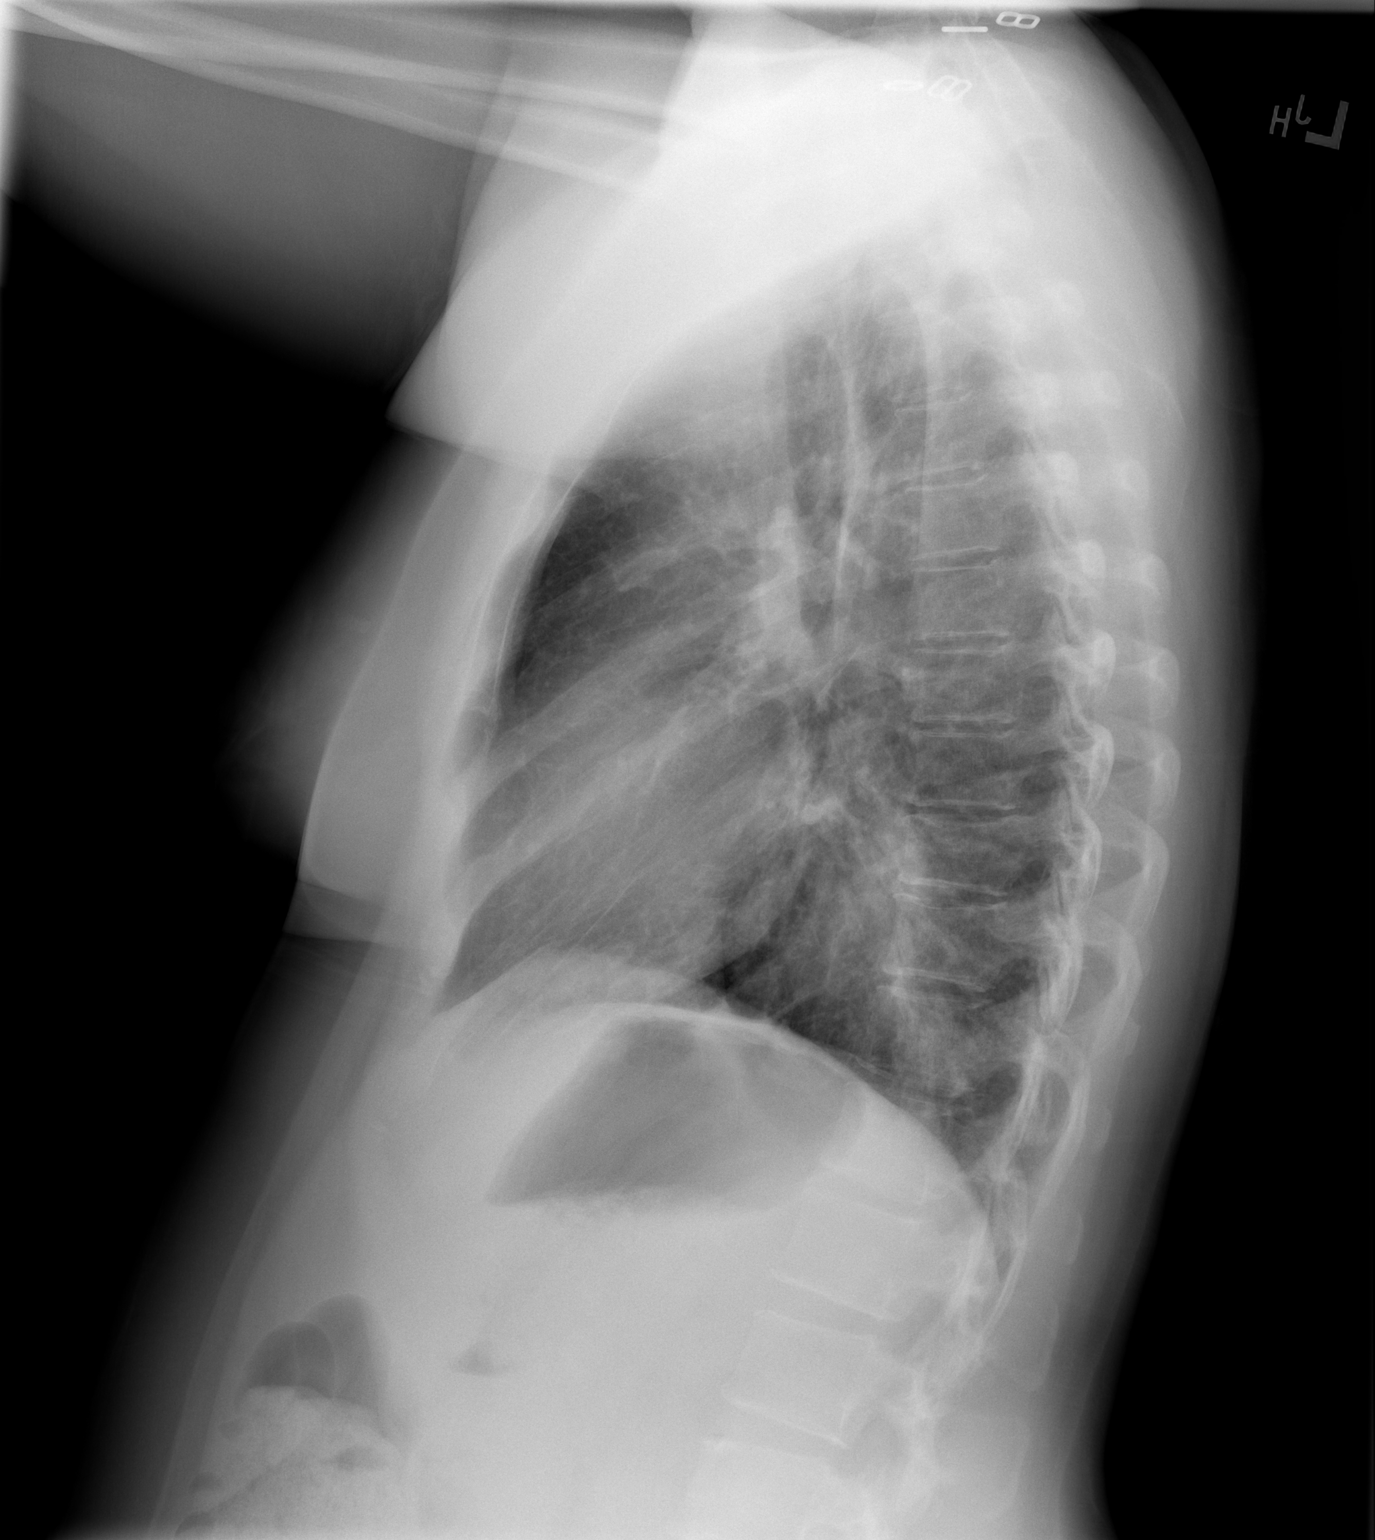

[2 of 2 positions shown; findings below may reference images not displayed]

FINDINGS: Lungs are adequately inflated and demonstrate airspace consolidation
over the posterior left lower lobe likely a pneumonia. No evidence
of effusion. Cardiomediastinal silhouette and remainder of the exam
is unchanged.
IMPRESSION: Left lower lobe pneumonia.

## 2018-12-24 ENCOUNTER — Encounter: Payer: Self-pay | Admitting: *Deleted

## 2018-12-24 ENCOUNTER — Telehealth: Payer: Self-pay | Admitting: *Deleted

## 2018-12-24 NOTE — Telephone Encounter (Signed)
Called patient and updated her EMR.

## 2018-12-25 ENCOUNTER — Other Ambulatory Visit: Payer: Self-pay

## 2018-12-25 ENCOUNTER — Ambulatory Visit (INDEPENDENT_AMBULATORY_CARE_PROVIDER_SITE_OTHER): Payer: Managed Care, Other (non HMO) | Admitting: Diagnostic Neuroimaging

## 2018-12-25 ENCOUNTER — Encounter: Payer: Self-pay | Admitting: Diagnostic Neuroimaging

## 2018-12-25 DIAGNOSIS — E1042 Type 1 diabetes mellitus with diabetic polyneuropathy: Secondary | ICD-10-CM

## 2018-12-25 MED ORDER — DULOXETINE HCL 60 MG PO CPEP: 60.0000 mg | ORAL_CAPSULE | Freq: Every day | ORAL | 4 refills | Status: DC

## 2018-12-25 MED ORDER — GABAPENTIN (ONCE-DAILY) 600 MG PO TABS: 1800.0000 mg | ORAL_TABLET | Freq: Every evening | ORAL | 4 refills | Status: DC

## 2018-12-25 NOTE — Progress Notes (Signed)
      Virtual Visit via Video Note  I connected with Brandi Weber on 12/25/18 at  2:00 PM EDT by a video enabled telemedicine application and verified that I am speaking with the correct person using two identifiers.   I discussed the limitations of evaluation and management by telemedicine and the availability of in person appointments. The patient expressed understanding and agreed to proceed.  History of Present Illness: - neuropathy is stable; meds are stable; gabapentin and duloxetine are working - left side toe injury (remote) has been acting up lately; under care of foot specialist now - hands are falling asleep intermittently (5-6 x per week; few minutes at a time)    Observations/Objective:  - awake, alert - face symm - no tremor   Assessment and Plan:  53 y.o.  female here with type 1 insulin-dependent diabetes since age 51 years old, with worsening diabetes control in the last 1 year, now with evidence of diabetic peripheral neuropathy. Right lower extremity pain may represent focal diabetic neuropathy versus diabetic lumbosacral plexopathy.  Dx:  1. Diabetic polyneuropathy associated with type 1 diabetes mellitus (Brandi Weber)       DIABETIC NEUROPATHY - continue gralise (extended release gabapentin) 1800mg  at dinner time - continue cymbalta 60mg  daily - continue topical agents (neuropathy cream) - consider vitamin support (multi-vitamin, b-complex, folic acid, alpha lipoic acid) - may consider EMG/NCS for hand numbness in future  Meds ordered this encounter  Medications  . Gabapentin, Once-Daily, (GRALISE) 600 MG TABS    Sig: Take 1,800 mg by mouth every evening. With dinner    Dispense:  270 tablet    Refill:  4  . DULoxetine (CYMBALTA) 60 MG capsule    Sig: Take 1 capsule (60 mg total) by mouth daily.    Dispense:  90 capsule    Refill:  4    DIABETES CONTROL - continue to improve diabetes control with nutrition, exercise and insulin  INSOMNIA /  SLEEP DEPRIVATION - continue to improve sleep hygiene - consider psychology evaluation   Follow Up Instructions:  - Return in about 1 year (around 12/25/2019).    I discussed the assessment and treatment plan with the patient. The patient was provided an opportunity to ask questions and all were answered. The patient agreed with the plan and demonstrated an understanding of the instructions.   The patient was advised to call back or seek an in-person evaluation if the symptoms worsen or if the condition   I spent 15 minutes of non-face to face time with patient.     Penni Bombard, MD 11/21/6281, 1:51 PM Certified in Neurology, Neurophysiology and Neuroimaging  [facility] [address] [place], [place] [address] [phone]

## 2019-01-07 ENCOUNTER — Ambulatory Visit: Payer: Managed Care, Other (non HMO) | Admitting: Diagnostic Neuroimaging

## 2019-02-04 ENCOUNTER — Other Ambulatory Visit: Payer: Self-pay | Admitting: Internal Medicine

## 2019-02-04 ENCOUNTER — Ambulatory Visit
Admission: RE | Admit: 2019-02-04 | Discharge: 2019-02-04 | Disposition: A | Discharge status: Home / Self Care | Payer: Managed Care, Other (non HMO) | Source: Ambulatory Visit | Attending: Internal Medicine | Admitting: Internal Medicine

## 2019-02-04 DIAGNOSIS — M795 Residual foreign body in soft tissue: Secondary | ICD-10-CM

## 2019-03-04 ENCOUNTER — Telehealth: Payer: Self-pay | Admitting: Diagnostic Neuroimaging

## 2019-03-04 DIAGNOSIS — E1042 Type 1 diabetes mellitus with diabetic polyneuropathy: Secondary | ICD-10-CM

## 2019-03-04 DIAGNOSIS — R200 Anesthesia of skin: Secondary | ICD-10-CM

## 2019-03-04 NOTE — Telephone Encounter (Signed)
EMG with NCS ordered per Dr Leta Baptist.

## 2019-03-04 NOTE — Telephone Encounter (Signed)
Pt called in and stated she would like to schedule a Nerve Conduction Study , she is agreeable to what was discussed at visit. Please advise

## 2019-03-11 ENCOUNTER — Telehealth: Payer: Self-pay | Admitting: Diagnostic Neuroimaging

## 2019-03-11 MED ORDER — GRALISE 600 MG PO TABS: 1800.0000 mg | ORAL_TABLET | Freq: Every evening | ORAL | 0 refills | Status: DC

## 2019-03-11 NOTE — Telephone Encounter (Addendum)
Two week Rx sent to [facility] with note to pharmacy: partial fill until patient gets Rx form mail order. I called patient and advised her of Rx sent to [facility];  she stated she may only need 1 week but was appreciative. She then asked about her EMG order. I advised her I will let Elmyra Ricks know she was asking about it. She verbalized understanding, appreciation. Message sent to Elmyra Ricks to advise her.

## 2019-03-11 NOTE — Telephone Encounter (Signed)
Pt has called to inform that she is having issues in getting her Gabapentin, Once-Daily, (GRALISE) 600 MG TABS from [place] . Pt states [facility] is covering her for a 7 day script to be sent to [place] [phone] (3 600mg  tablets a day ) Pt is asking that this be sent as urgent.  Pt is asking for a call from RN to confirm this request has been submitted to pharmacy.  Pt only has enough for tonight

## 2019-03-11 NOTE — Addendum Note (Signed)
Addended by: Minna Antis on: 03/11/2019 11:17 AM   Modules accepted: Orders

## 2019-04-15 ENCOUNTER — Telehealth: Payer: Self-pay | Admitting: Diagnostic Neuroimaging

## 2019-04-15 NOTE — Telephone Encounter (Signed)
I faxed pt medical records today to [phone] attn new pt coordinator.

## 2019-04-15 NOTE — Telephone Encounter (Signed)
Noted, will be faxed by medical records.

## 2019-04-15 NOTE — Telephone Encounter (Signed)
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

## 2019-05-21 ENCOUNTER — Encounter (INDEPENDENT_AMBULATORY_CARE_PROVIDER_SITE_OTHER): Payer: Managed Care, Other (non HMO) | Admitting: Diagnostic Neuroimaging

## 2019-05-21 ENCOUNTER — Other Ambulatory Visit: Payer: Self-pay

## 2019-05-21 ENCOUNTER — Ambulatory Visit (INDEPENDENT_AMBULATORY_CARE_PROVIDER_SITE_OTHER): Payer: Managed Care, Other (non HMO) | Admitting: Diagnostic Neuroimaging

## 2019-05-21 DIAGNOSIS — Z0289 Encounter for other administrative examinations: Secondary | ICD-10-CM

## 2019-05-21 DIAGNOSIS — R200 Anesthesia of skin: Secondary | ICD-10-CM

## 2019-05-21 DIAGNOSIS — E1042 Type 1 diabetes mellitus with diabetic polyneuropathy: Secondary | ICD-10-CM

## 2019-05-27 NOTE — Procedures (Signed)
[facility]  NCS (NERVE CONDUCTION STUDY) WITH EMG (ELECTROMYOGRAPHY) REPORT   STUDY DATE: 05/27/19 PATIENT NAME: Brandi Weber DOB: 04/14/1966 MRN: [id number]  ORDERING CLINICIAN: Andrey Spearman, MD   TECHNOLOGIST: Sherre Scarlet ELECTROMYOGRAPHER: Earlean Polka. Sanii Kukla, MD  CLINICAL INFORMATION: 53 year old female with numbness.  FINDINGS: NERVE CONDUCTION STUDY:  Bilateral median motor response of prolonged distal latencies (right 8.5 ms, left 6.8 ms), decreased amplitudes, normal conduction velocities.  Right ulnar motor responses normal.  Right median sensory response could not be obtained.  Left median sensory response is prolonged peak latency and decreased amplitude.  Left ulnar sensory responses normal.  Right ulnar F-wave latency is normal.    NEEDLE ELECTROMYOGRAPHY:  Needle examination of left upper extremity is normal.   IMPRESSION:   Abnormal study demonstrating: - Severe right and moderate left median neuropathies at the wrist consistent with severe right and moderate left carpal tunnel syndrome.    INTERPRETING PHYSICIAN:  Penni Bombard, MD Certified in Neurology, Neurophysiology and Neuroimaging  [facility] [address], [place], [place] [address] [phone]   [facility]    Nerve / Sites Muscle Latency Ref. Amplitude Ref. Rel Amp Segments Distance Velocity Ref. Area    ms ms mV mV %  cm m/s m/s mVms  R Median - APB     Wrist APB 8.5 ?4.4 3.1 ?4.0 100 Wrist - APB 7   12.8     Upper arm APB 12.1  2.1  68.7 Upper arm - Wrist 22 61 ?49 16.1  L Median - APB     Wrist APB 6.8 ?4.4 3.3 ?4.0 100 Wrist - APB 7   12.5     Upper arm APB 11.1  2.9  86.4 Upper arm - Wrist 21 49 ?49 11.9  R Ulnar - ADM     Wrist ADM 3.1 ?3.3 6.7 ?6.0 100 Wrist - ADM 7   24.9     B.Elbow ADM 6.4  6.2  93.4 B.Elbow - Wrist 18 55 ?49 23.1     A.Elbow ADM 8.3  6.1  98.6 A.Elbow - B.Elbow 10 53 ?49 22.7         A.Elbow -  Wrist               SNC    Nerve / Sites Rec. Site Peak Lat Ref.  Amp Ref. Segments Distance    ms ms V V  cm  R Median - Orthodromic (Dig II, Mid palm)     Dig II Wrist NR ?3.4 NR ?10 Dig II - Wrist 13  L Median - Orthodromic (Dig II, Mid palm)     Dig II Wrist 3.6 ?3.4 1 ?10 Dig II - Wrist 13  R Ulnar - Orthodromic, (Dig V, Mid palm)     Dig V Wrist 3.0 ?3.1 5 ?5 Dig V - Wrist 90           F  Wave    Nerve F Lat Ref.   ms ms  R Ulnar - ADM 28.5 ?32.0       EMG full       EMG Summary Table    Spontaneous MUAP Recruitment  Muscle IA Fib PSW Fasc Other Amp Dur. Poly Pattern  L. Deltoid Normal None None None _______ Normal Normal Normal Normal  L. Biceps brachii Normal None None None _______ Normal Normal Normal Normal  L. Triceps brachii Normal None None None _______ Normal Normal Normal Normal  L. Flexor carpi radialis Normal None None None _______ Normal Normal Normal Normal  L. First dorsal interosseous Normal None None None _______ Normal Normal Normal Normal

## 2019-07-29 ENCOUNTER — Telehealth: Payer: Self-pay | Admitting: Diagnostic Neuroimaging

## 2019-07-29 NOTE — Telephone Encounter (Signed)
Pt has called to 1 ask that the results of her NCV from 09-08 be faxed to the attention of Dr Berenice Primas and RN [facility] @ [phone].  Also Pt states she is having break thru pain especially at night in her feet.  This is in spite of her taking the Gabapentin, Once-Daily, (GRALISE) 600 MG TABS and the DULoxetine (CYMBALTA) 60 MG capsule please call

## 2019-07-29 NOTE — Telephone Encounter (Signed)
Spoke with patient and advised I faxed NCS results this morning. Advised Dr Leta Baptist wants her to try increasing gralise to 2400 mg every evening for trial. If it's helpful, she can call for a new Rx. She  verbalized understanding, appreciation. She then stated she wanted Dr Leta Baptist to know Dr Berenice Primas, ortho diagnosed her with trigger finger and recommended outpatient surgery. She wanted NCS sent to him to review for possible need of carpal tunnel surgery, so they could be done at same time. I advised will let Dr Leta Baptist know. Marland Kitchen

## 2019-07-29 NOTE — Telephone Encounter (Signed)
Could increase gralise to 2400mg  every evening.   Penni Bombard, MD [id number], [id number] PM Certified in Neurology, Neurophysiology and Neuroimaging  [facility] [address] [place],  [address] [phone]

## 2019-08-18 MED ORDER — GRALISE 600 MG PO TABS: 2400.0000 mg | ORAL_TABLET | Freq: Every evening | ORAL | 5 refills | Status: DC

## 2019-08-18 NOTE — Addendum Note (Signed)
Addended by: Minna Antis on: 08/18/2019 09:05 AM   Modules accepted: Orders

## 2019-08-18 NOTE — Telephone Encounter (Signed)
Pt called back and stated that the 2400mg  of her Gralise is working and would like a new Rx sent in to [facility]

## 2019-08-18 NOTE — Telephone Encounter (Signed)
New Rx sent per verbal order, Dr Leta Baptist.

## 2019-08-27 ENCOUNTER — Other Ambulatory Visit: Payer: Self-pay

## 2019-08-27 MED ORDER — GABAPENTIN 600 MG PO TABS: 600.0000 mg | ORAL_TABLET | Freq: Every evening | ORAL | 0 refills | Status: DC

## 2019-08-27 NOTE — Telephone Encounter (Signed)
Patient called to inform pharmacy is requesting an updated prescription of the gralise with new dosage and for a 90 day qty.  Patient states she only has 8 days of her medication left and has been informed by the pharmacy they would be unable to ship medication until 12/22. Patient is requesting a 2 week supply be sent to her local pharmacy to get her by until she receives her medication. Please follow up.  Local pharmacy: [facility] on [address] in [place]

## 2019-08-27 NOTE — Telephone Encounter (Signed)
Refill sent for 14 days to local pharmacy until mail order comes on 09/08/2019.

## 2019-08-27 NOTE — Telephone Encounter (Signed)
Pt is calling in upset due to not getting a call back regarding message below, please advise

## 2019-08-31 ENCOUNTER — Encounter: Payer: Self-pay | Admitting: Diagnostic Neuroimaging

## 2019-08-31 ENCOUNTER — Telehealth: Payer: Self-pay | Admitting: Diagnostic Neuroimaging

## 2019-08-31 ENCOUNTER — Other Ambulatory Visit: Payer: Self-pay | Admitting: *Deleted

## 2019-08-31 MED ORDER — DULOXETINE HCL 30 MG PO CPEP: 90.0000 mg | ORAL_CAPSULE | Freq: Every day | ORAL | 4 refills | Status: DC

## 2019-08-31 MED ORDER — GRALISE 600 MG PO TABS: 2400.0000 mg | ORAL_TABLET | Freq: Every day | ORAL | 1 refills | Status: DC

## 2019-08-31 NOTE — Telephone Encounter (Addendum)
Spoke with patient who stated that the last Rx sent to [facility] was only 30 day supply. They charge her same amount for 30 vs 90 day supply. She needs new Rx sent to [facility] for Gralise 90 days. She reports new issues with toes numb this morning and burning sensation in ankles every now and then, feet getting cold when she had a cold stress test on her hands. She wonders if that has any correlation to her neuropathy. She continues to have break through pain in her feet even with increased dose of gralise and now getting pain in her hands. She asked if she needs to be seen or what can be done for her new issues. I advised I;ll send in new RX and advise Dr Leta Baptist of her new issues. I'll call her back. Patient verbalized understanding, appreciation.

## 2019-08-31 NOTE — Telephone Encounter (Addendum)
Spoke with patient and advised her Dr Leta Baptist stated to continue gralise 2400mg  daily. She may consider an increase in cymbalta to 90mg  daily. She may use OTC alpha-lipoic acid 600mg  daily, OTC capsaicin and lidocaine cream. She may need to consider pain mangement clinic evaluation for increasing, uncontrolled pain. She stated the cymbalta made a big difference, so she would like the dose increased to 90 mg daily as Dr Leta Baptist advised. She will need Rx for 90 day supply to [facility].

## 2019-08-31 NOTE — Telephone Encounter (Signed)
Pt called in and stated that she needs a 90day supply and not a 30day supple, she also states she is continuing to have the break through pain , and this when she woke up her feet were asleep. Pt is requesting a call back to discuss.

## 2019-08-31 NOTE — Telephone Encounter (Signed)
Meds ordered this encounter  Medications  . DISCONTD: Gabapentin, Once-Daily, (GRALISE) 600 MG TABS    Sig: Take 2,400 mg by mouth every evening. With dinner    Dispense:  120 tablet    Refill:  5  . DULoxetine (CYMBALTA) 30 MG capsule    Sig: Take 3 capsules (90 mg total) by mouth daily.    Dispense:  270 capsule    Refill:  [place], MD 0000000, [id number] PM Certified in Neurology, Neurophysiology and [facility] [address], [place], [place] [address] [phone]

## 2019-08-31 NOTE — Addendum Note (Signed)
Addended by: Andrey Spearman R on: 08/31/2019 05:24 PM   Modules accepted: Orders

## 2019-08-31 NOTE — Telephone Encounter (Signed)
i think sxs are related to neuropathy. Continue gralise . Ok to change to 90 day rx. -VRP

## 2019-09-03 NOTE — Telephone Encounter (Signed)
Error

## 2019-09-08 ENCOUNTER — Telehealth: Payer: Self-pay | Admitting: Diagnostic Neuroimaging

## 2019-09-08 MED ORDER — GRALISE 600 MG PO TABS: 2400.0000 mg | ORAL_TABLET | Freq: Every day | ORAL | 0 refills | Status: DC

## 2019-09-08 NOTE — Telephone Encounter (Signed)
Pt is needing her Gabapentin, Once-Daily, (GRALISE) 600 MG TABS called in to the [facility] due to her mail order being delayed  Please advise.

## 2019-09-08 NOTE — Telephone Encounter (Signed)
Returned call to husband, [name] on [place] who stated [facility] has mailed Gralise but he can no longer track it. He is asking for 2 week supply sent to [facility] until she gets mail order. The Rx sent in to [facility] on 08/27/19 was not for Gralise, so she doesn't want to take it. I advised james we'll send in temporary supply for 2 weeks , Gralise 600 mg tabs, take 4 tabs every evening. He verbalized understanding, appreciation.

## 2019-12-12 IMAGING — CR ABDOMEN - 2 VIEW
2 series · 2 of 2 positions shown · non-contrast
Comparison: None.

CLINICAL DATA: Rule out foreign body. Penafiel diabetic sensor right
lower quadrant

EXAM:
ABDOMEN - 2 VIEW

[w abdomen upright]
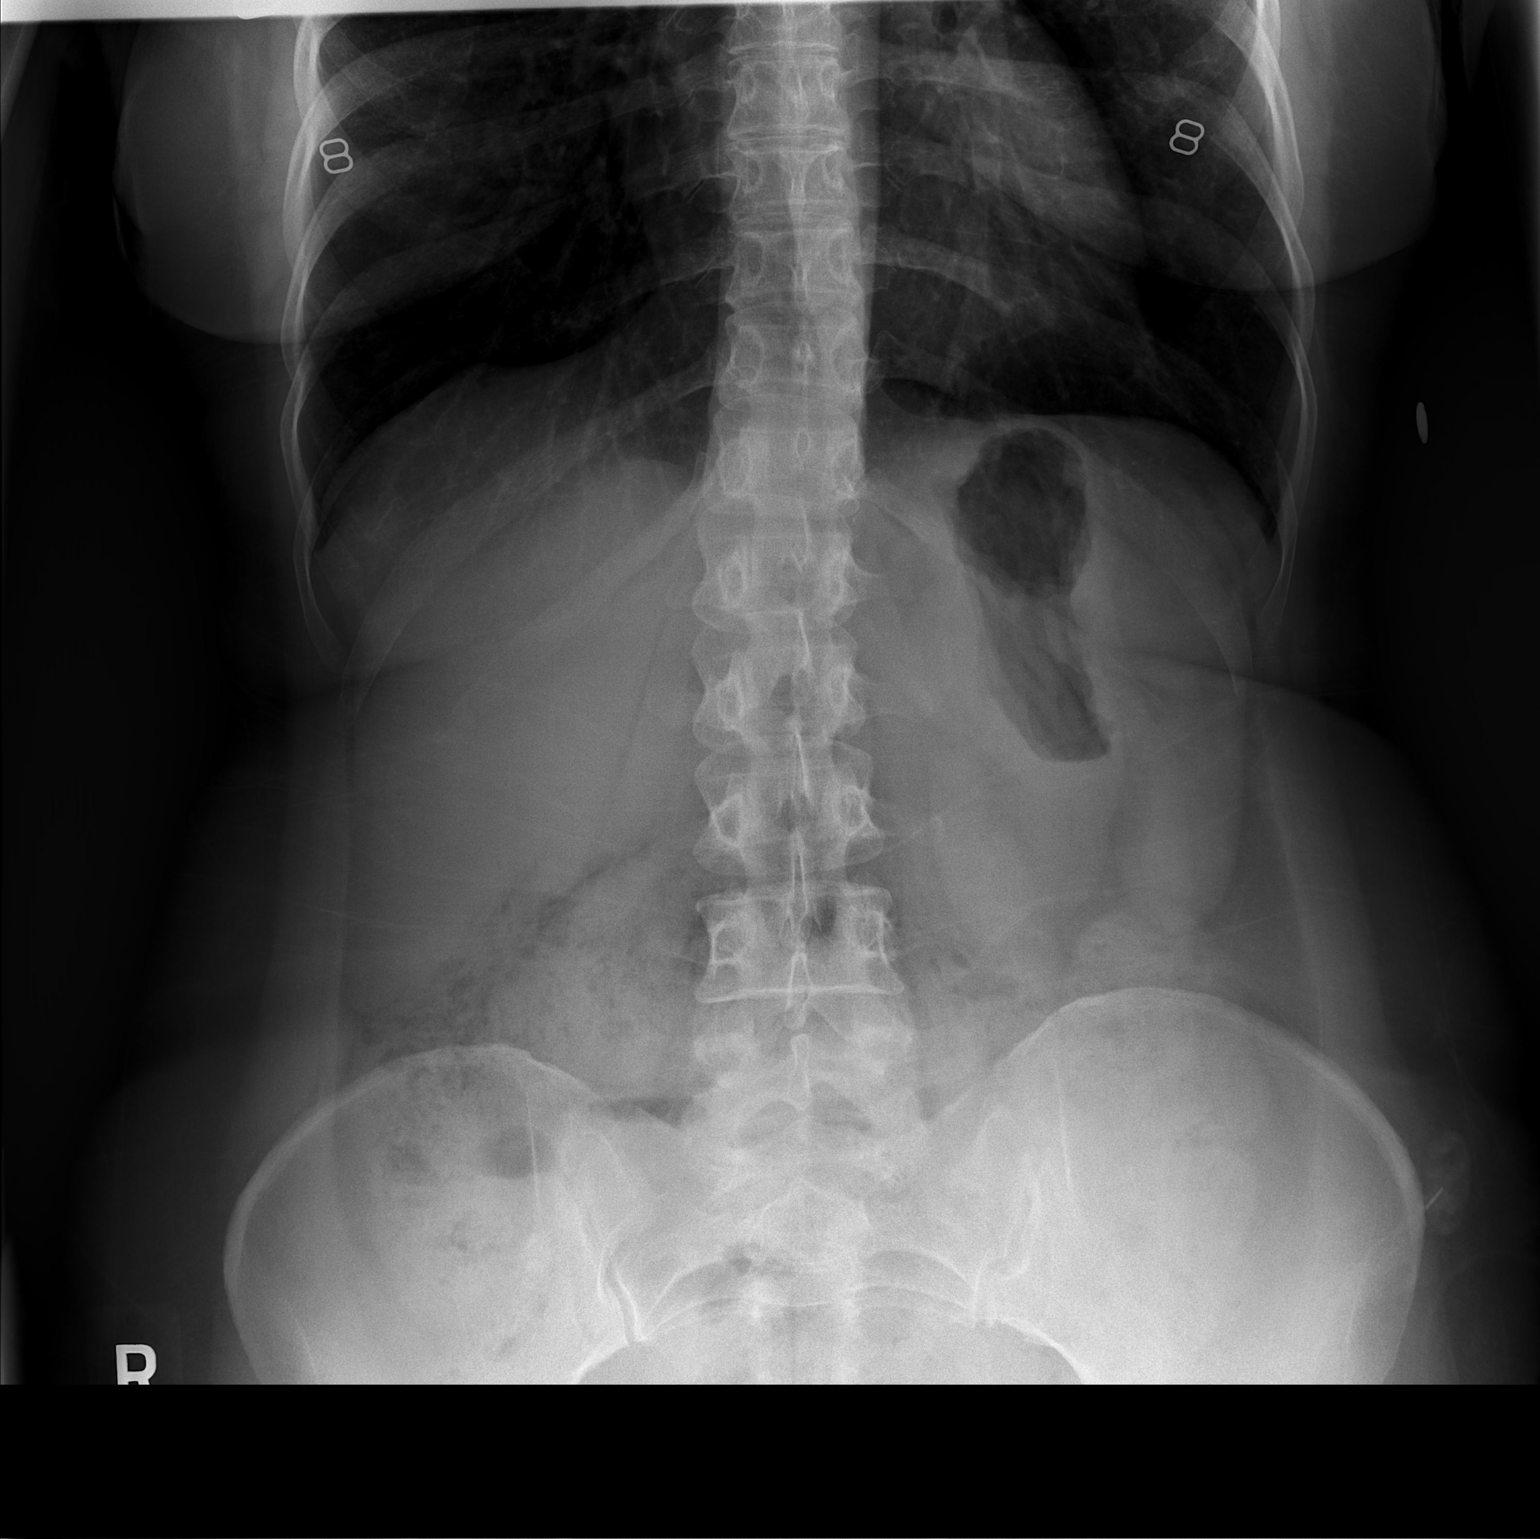

[t abdomen supine]
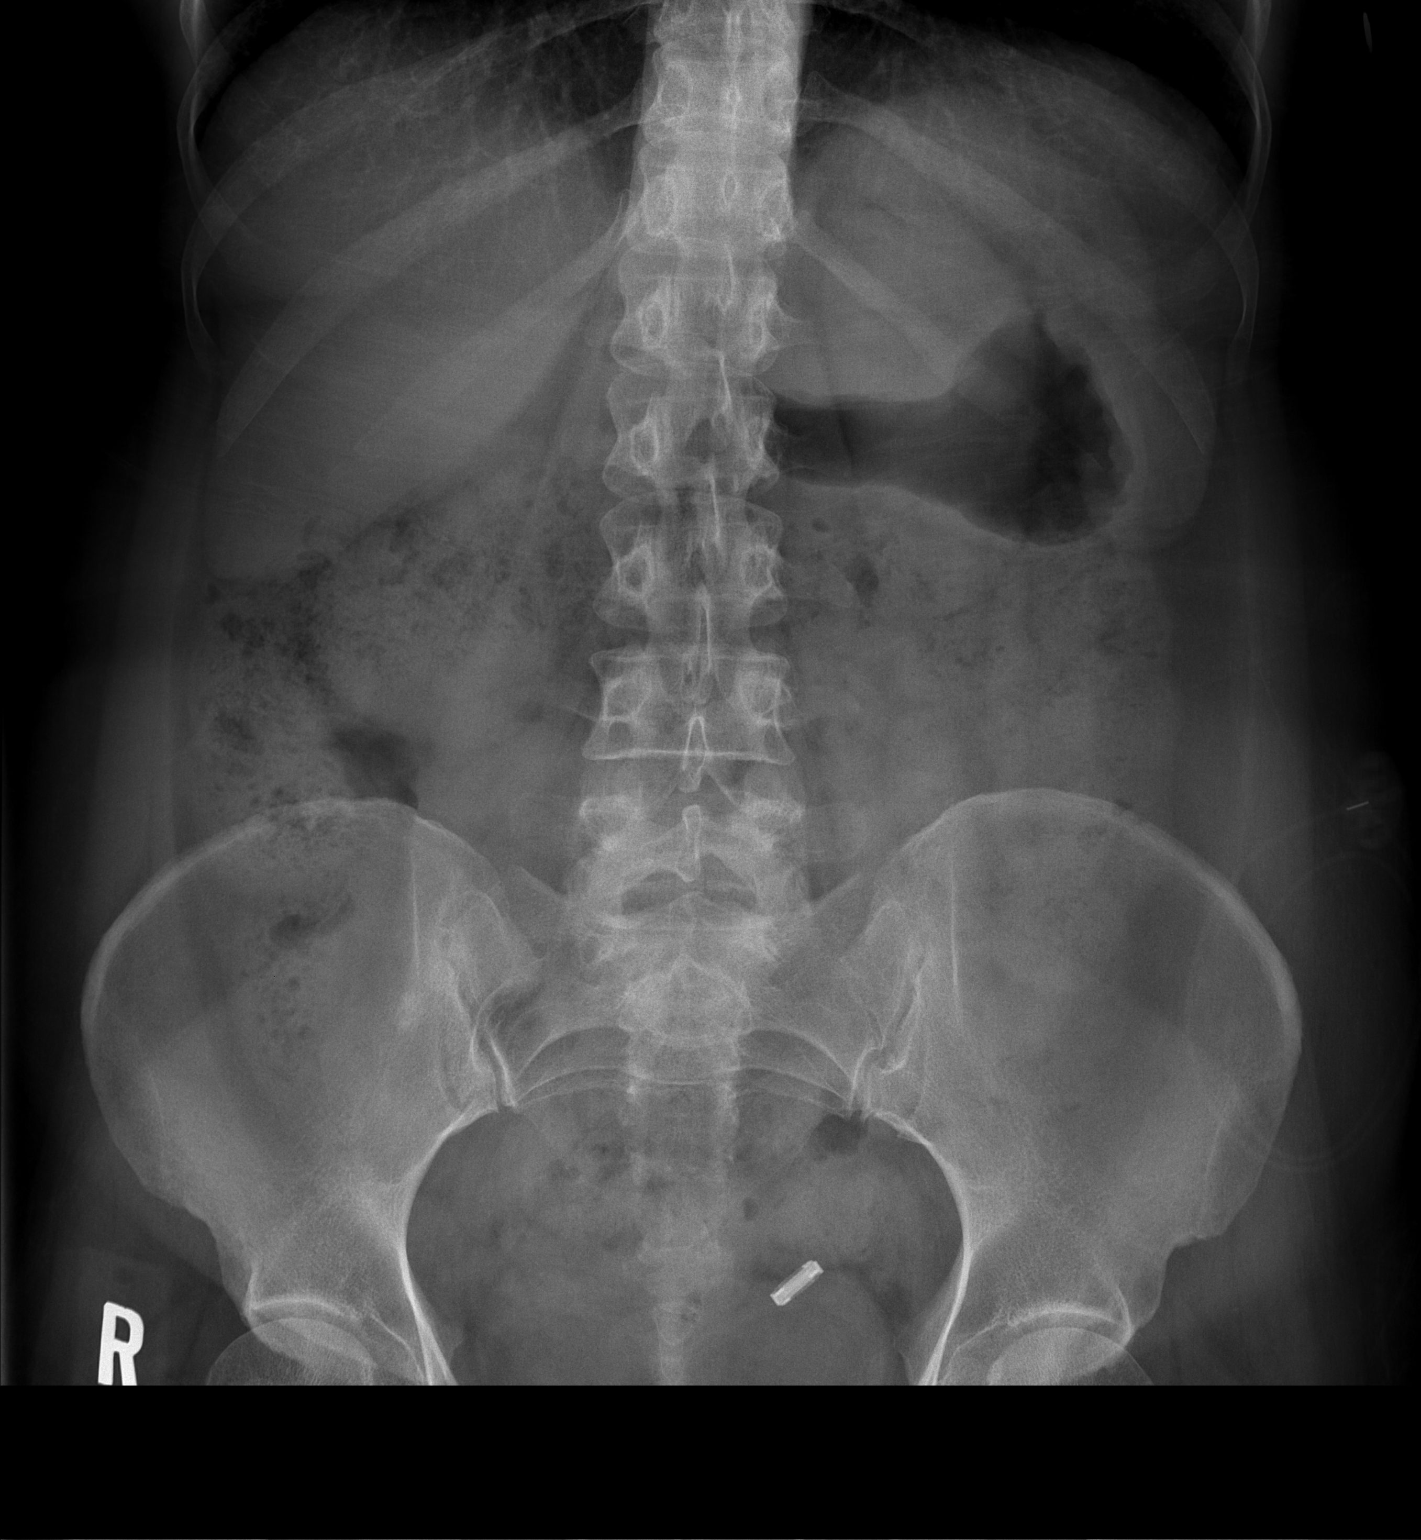

[2 of 2 positions shown; findings below may reference images not displayed]

FINDINGS: Glucose monitoring device was placed in the subcutaneous tissues
right lower quadrant by the patient cannot be located. No radiopaque
foreign body is seen in the right abdomen

There is a metal clip overlying the left pelvis most likely due to
fallopian tube clips.

Normal bowel gas pattern.  No acute skeletal abnormality.
IMPRESSION: No radiopaque foreign body in the right lower quadrant detected.

## 2020-04-12 ENCOUNTER — Encounter: Payer: Self-pay | Admitting: Adult Health

## 2020-04-12 ENCOUNTER — Ambulatory Visit (INDEPENDENT_AMBULATORY_CARE_PROVIDER_SITE_OTHER): Payer: 59 | Admitting: Adult Health

## 2020-04-12 ENCOUNTER — Telehealth: Payer: Self-pay | Admitting: Adult Health

## 2020-04-12 VITALS — BP 126/80 | HR 93 | Ht 62.0 in | Wt 161.8 lb

## 2020-04-12 DIAGNOSIS — G5603 Carpal tunnel syndrome, bilateral upper limbs: Secondary | ICD-10-CM | POA: Diagnosis not present

## 2020-04-12 DIAGNOSIS — E1042 Type 1 diabetes mellitus with diabetic polyneuropathy: Secondary | ICD-10-CM

## 2020-04-12 MED ORDER — GRALISE 600 MG PO TABS: 2400.0000 mg | ORAL_TABLET | Freq: Every day | ORAL | 3 refills | Status: DC

## 2020-04-12 NOTE — Telephone Encounter (Signed)
Patient stated she would not like to schedule her yearly f/u at this time, will call back to schedule closer to 03/2021.

## 2020-04-12 NOTE — Progress Notes (Signed)
[facility]  PATIENT: Brandi Weber DOB: September 05, 1966  REFERRING CLINICIAN: Barbette Hair HISTORY FROM: patient REASON FOR VISIT: follow up   HISTORICAL  CHIEF COMPLAINT:  Chief Complaint  Patient presents with  . Follow-up    Here for med f/u. Needs refills for gabapentin and duloxetine.   . room 5    alone    HISTORY OF PRESENT ILLNESS:   Today, 04/12/2020, Ms. Stender returns for yearly follow-up regarding diabetic polyneuropathy.  Neuropathy has been stable with ongoing use of Gralise and duloxetine tolerating well without side effects.  Gralise dosage increased in 07/2019 to 2400 mg daily with improvement.  Underwent EMG/NCS on 05/21/2019 by Dr. Leta Baptist which showed severe right and moderate left median neuropathies at the wrist consistent with severe right and moderate left carpal tunnel syndrome.  She plans on undergoing carpal tunnel release but this did not improve.  Carpal tunnel symptoms interfere with daily activity and routine.  She is interested in pursuing carpal tunnel release and requesting referral.     History provided for reference purposes only Update via virtual visit 12/25/2018 VRP:  - neuropathy is stable; meds are stable; gabapentin and duloxetine are working - left side toe injury (remote) has been acting up lately; under care of foot specialist now - hands are falling asleep intermittently (5-6 x per week; few minutes at a time)  UPDATE (12/25/17, VRP): Since last visit, had fevers, chills, neuropathy pain a few weeks ago, dx'd with pneumonia. Now s/p antibiotics. Tolerating meds. No alleviating or aggravating factors. Gralise is better for sedation. Still with insomnia. Also with gradual weight gain. Also with decreased libido.   UPDATE (05/28/17, VRP): Since last visit, doing better. Tolerating gabapentin and compounded cream. No alleviating or aggravating factors.   UPDATE 08/31/16: Since last visit, was doing well, until last few  weeks. More foot pain issues. More sleep issues (busy life) and averaging only 3-4 hours per night. Tolerating gabapentin 600mg  TID.   PRIOR HPI (05/18/16): 54 year old right-handed female here for evaluation of lower extremity pain. Patient has history of insulin-dependent type 1 diabetes since age 67 years old. Patient had done well until last year when she became somewhat burnt out with her diabetes management and therefore developed poor diabetes control. Ultimately this led to diabetic ketoacidosis and hospitalization in February 2017. Hemoglobin A1c was up to 15. Following this patient started to have increasing pain in her lower extremities especially her right leg. Patient had 3 different types of pain: Bilateral toe numbness and burning, right greater than left lower extremity pain, right thigh pain. Patient is tried gabapentin without relief. Cymbalta was added which started to help symptom control. She purchased capsaicin cream but has not tried this yet. For past 8-10 years patient has had intermittent restless leg symptoms, foot cramps, especially at nighttime before going to sleep. She typically would have to stand up and walk around. Patient denies any significant low back pain. She denies low back pain radiating to legs. No problems with her hands or arms. Patient is now having better diabetes control and hemoglobin A1c is down to 10.   REVIEW OF SYSTEMS: Full 14 system review of systems performed and negative with exception of: Pain, numbness/tingling   ALLERGIES: Allergies  Allergen Reactions  . Doxycycline     Seeing spots  . Sulfa Antibiotics     Seeing spots  . Tape     Surgical tape     HOME MEDICATIONS: Outpatient Medications Prior to Visit  Medication Sig Dispense Refill  . DULoxetine (CYMBALTA) 30 MG capsule Take 3 capsules (90 mg total) by mouth daily. 270 capsule 4  . glucagon (GLUCAGON EMERGENCY) 1 MG injection As needed    . insulin glargine (LANTUS) 100 UNIT/ML  injection Inject into the skin. As needed, has pump    . insulin lispro (HUMALOG) 100 UNIT/ML injection Inject into the skin. humalog per pump    . meloxicam (MOBIC) 7.5 MG tablet as needed.    . NONFORMULARY OR COMPOUNDED ITEM Topical cream for neuropathy  (aspirar)    . Pramlintide Acetate (SYMLIN [place]) Inject into the skin. Titrated up to 60 mcg in 15 mcg increments, taking 45 mcg w/meals 12/24/18    . Gabapentin, Once-Daily, (GRALISE) 600 MG TABS Take 2,400 mg by mouth daily. 60 tablet 0   No facility-administered medications prior to visit.    PAST MEDICAL HISTORY: Past Medical History:  Diagnosis Date  . Arthritis    left big toe  . CLL (chronic lymphocytic leukemia) ([place]) 2020  . Diabetes mellitus without complication (HCC)    insulin dependent  . Neuropathy    diabetic  . Pneumonia 12/2017    PAST SURGICAL HISTORY: Past Surgical History:  Procedure Laterality Date  . CESAREAN SECTION     x 3  . TUBAL LIGATION  04/2007    FAMILY HISTORY: Family History  Problem Relation Age of Onset  . Cancer Maternal Grandfather     SOCIAL HISTORY:  Social History   Socioeconomic History  . Marital status: Married    Spouse name: [name]  . Number of children: 4  . Years of education: 87  . Highest education level: Not on file  Occupational History    Comment: na  Tobacco Use  . Smoking status: Never Smoker  . Smokeless tobacco: Never Used  Substance and Sexual Activity  . Alcohol use: Yes    Comment: occasional  . Drug use: No  . Sexual activity: Not on file  Other Topics Concern  . Not on file  Social History Narrative   Lives at home with husband, 4 children   Caffeine -   Social Determinants of Health   Financial Resource Strain:   . Difficulty of Paying Living Expenses:   Food Insecurity:   . Worried About Charity fundraiser in the Last Year:   . Arboriculturist in the Last Year:   Transportation Needs:   . Film/video editor (Medical):   Marland Kitchen Lack of  Transportation (Non-Medical):   Physical Activity:   . Days of Exercise per Week:   . Minutes of Exercise per Session:   Stress:   . Feeling of Stress :   Social Connections:   . Frequency of Communication with Friends and Family:   . Frequency of Social Gatherings with Friends and Family:   . Attends Religious Services:   . Active Member of Clubs or Organizations:   . Attends Archivist Meetings:   Marland Kitchen Marital Status:   Intimate Partner Violence:   . Fear of Current or Ex-Partner:   . Emotionally Abused:   Marland Kitchen Physically Abused:   . Sexually Abused:      PHYSICAL EXAM  GENERAL EXAM/CONSTITUTIONAL: Vitals:  Vitals:   04/12/20 1040  BP: 126/80  Pulse: 93  Weight: 161 lb 12.8 oz (73.4 kg)  Height: 5\' 2"  (1.575 m)   Wt Readings from Last 3 Encounters:  04/12/20 161 lb 12.8 oz (73.4 kg)  12/25/17 155 lb (  70.3 kg)  05/28/17 153 lb 12.8 oz (69.8 kg)   Body mass index is 29.59 kg/m. No exam data present  Patient is in no distress; well developed, nourished and groomed; neck is supple  CARDIOVASCULAR:  Examination of carotid arteries is normal; no carotid bruits  Regular rate and rhythm, no murmurs  Examination of peripheral vascular system by observation and palpation is normal  EYES:  Ophthalmoscopic exam of optic discs and posterior segments is normal; no papilledema or hemorrhages  MUSCULOSKELETAL:  Gait, strength, tone, movements noted in Neurologic exam below  NEUROLOGIC: MENTAL STATUS:   awake, alert, oriented to person, place and time  recent and remote memory intact  normal attention and concentration  language fluent, comprehension intact, naming intact,   fund of knowledge appropriate  CRANIAL NERVE:   2nd, 3rd, 4th, 6th - pupils equal and reactive to light, visual fields full to confrontation, extraocular muscles intact, no nystagmus  5th - facial sensation symmetric  7th - facial strength symmetric  8th - hearing intact  9th  - palate elevates symmetrically, uvula midline  11th - shoulder shrug symmetric  12th - tongue protrusion midline  MOTOR:   normal bulk and tone, full strength in the BUE, BLE  SENSORY:   normal and symmetric to light touch, temperature, vibration; DEC PP IN FEET/ANKLES  COORDINATION:   finger-nose-finger, fine finger movements normal  REFLEXES:   deep tendon reflexes TRACE and symmetric  GAIT/STATION:   narrow based gait    DIAGNOSTIC DATA (LABS, IMAGING, TESTING) - I reviewed patient records, labs, notes, testing and imaging myself where available.  No results found for: WBC, HGB, HCT, MCV, PLT No results found for: NA, K, CL, CO2, GLUCOSE, BUN, CREATININE, CALCIUM, PROT, ALBUMIN, AST, ALT, ALKPHOS, BILITOT, GFRNONAA, GFRAA No results found for: CHOL, HDL, LDLCALC, LDLDIRECT, TRIG, CHOLHDL No results found for: HGBA1C No results found for: VITAMINB12 No results found for: TSH   05/21/2019 EMG/NCS - Severe right and moderate left median neuropathies at the wrist consistent with severe right and moderate left carpal tunnel syndrome  04/09/16 BLE arterial u/s  - no evidence of arterial occlusive disease.  01/26/16 BLE venous u/s  - no evidence of DVT within the right lower extremity  12/16/15 EMG/NCS - Mild sensory polyneuropathy     ASSESSMENT AND PLAN  54 y.o. year old female here with type 1 insulin-dependent diabetes since age 59 years old, with worsening diabetes control in the last 1 year, now with evidence of diabetic peripheral neuropathy. Right lower extremity pain may represent focal diabetic neuropathy versus diabetic lumbosacral plexopathy.  Recent EMG/NCS showed bilateral carpal tunnel syndrome severe on right side and moderate on left side.   DIABETIC NEUROPATHY - continue gralise (extended release gabapentin) 2400mg  at dinner time -refill provided - continue cymbalta 60mg  daily - continue topical agents (neuropathy cream) - consider vitamin  support (multi-vitamin, b-complex, folic acid, alpha lipoic acid)  CARPEL TUNNEL SYNDROME -severe right, moderate left -Referral will be placed to orthopedics to evaluate for possible carpal tunnel release procedure  DIABETES CONTROL - continue to improve diabetes control with nutrition, exercise and insulin -Continue to follow with endocrinology   Follow-up in 1 year or call earlier if needed    I spent 20 minutes of face-to-face and non-face-to-face time with patient.  This included previsit chart review, lab review, study review, order entry, electronic health record documentation, patient education regarding diabetic neuropathy, carpal tunnel syndrome, importance of diabetic control and answered all questions to patient  satisfaction  Frann Rider, AGNP-BC  [facility] [address] [place], [place] [address]  Phone [phone] Fax [phone] Note: This document was prepared with digital dictation and possible smart phrase technology. Any transcriptional errors that result from this process are unintentional.

## 2020-04-12 NOTE — Patient Instructions (Signed)
Your Plan:  Continue Gralise and Cymbalta -refill provided  Will further discuss with Dr. Leta Baptist for recommendations regarding orthopedic referral -we will call you with further information    Follow-up in 1 year or call earlier if needed     Thank you for coming to see [place] at [facility]. I hope we have been able to provide you high quality care today.  You may receive a patient satisfaction survey over the next few weeks. We would appreciate your feedback and comments so that we may continue to improve ourselves and the health of our patients.

## 2020-04-22 NOTE — Progress Notes (Signed)
I reviewed note and agree with plan.   Penni Bombard, MD 10/24/9483, 46:27 PM Certified in Neurology, Neurophysiology and Neuroimaging  [facility] [address], [place], [place] [address] [phone]

## 2020-08-16 ENCOUNTER — Other Ambulatory Visit: Payer: Self-pay

## 2020-08-16 ENCOUNTER — Ambulatory Visit (INDEPENDENT_AMBULATORY_CARE_PROVIDER_SITE_OTHER): Payer: 59 | Admitting: Diagnostic Neuroimaging

## 2020-08-16 ENCOUNTER — Encounter: Payer: Self-pay | Admitting: Diagnostic Neuroimaging

## 2020-08-16 VITALS — BP 167/88 | HR 81 | Ht 67.0 in | Wt 165.0 lb

## 2020-08-16 DIAGNOSIS — E1042 Type 1 diabetes mellitus with diabetic polyneuropathy: Secondary | ICD-10-CM | POA: Diagnosis not present

## 2020-08-16 MED ORDER — DULOXETINE HCL 30 MG PO CPEP: 90.0000 mg | ORAL_CAPSULE | Freq: Every day | ORAL | 4 refills | Status: DC

## 2020-08-16 MED ORDER — GRALISE 600 MG PO TABS: 4.0000 | ORAL_TABLET | Freq: Every evening | ORAL | 4 refills | Status: DC

## 2020-08-16 NOTE — Patient Instructions (Signed)
DIABETIC NEUROPATHY - continue gralise (extended release gabapentin) 2400mg  at dinner time - continue cymbalta 90mg  daily - consider capsaicin cream, lidocaine patch / cream, alpha-lipoic acid 600mg  daily - may need to consider pain mgmt referral in future  DIABETES CONTROL - continue to improve diabetes control with nutrition, exercise and insulin  INSOMNIA / SLEEP DEPRIVATION - continue to improve sleep hygiene - consider psychology evaluation  HYPERTENSION - check BP at home; follow up with PCP

## 2020-08-16 NOTE — Progress Notes (Signed)
[facility]  PATIENT: Brandi Weber DOB: 17-Jun-1966  REFERRING CLINICIAN: Barbette Hair HISTORY FROM: patient REASON FOR VISIT: follow up   HISTORICAL  CHIEF COMPLAINT:  Chief Complaint  Patient presents with  . 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 More swelling in bilateral feet. Insomnia continues (5 hours sleep per night). No alleviating or aggravating factors.    UPDATE (12/25/17, VRP): Since last visit, had fevers, chills, neuropathy pain a few weeks ago, dx'd with pneumonia. Now s/p antibiotics. Tolerating meds. No alleviating or aggravating factors. Gralise is better for sedation. Still with insomnia. Also with gradual weight gain. Also with decreased libido.   UPDATE (05/28/17, VRP): Since last visit, doing better. Tolerating gabapentin and compounded cream. No alleviating or aggravating factors.   UPDATE 08/31/16: Since last visit, was doing well, until last few weeks. More foot pain issues. More sleep issues (busy life) and averaging only 3-4 hours per night. Tolerating gabapentin 600mg  TID.   PRIOR HPI (05/18/16): 54 year old right-handed female here for evaluation of lower extremity pain. Patient has history of insulin-dependent type 1 diabetes since age 26 years old. Patient had done well until last year when she became somewhat burnt out with her diabetes management and therefore developed poor diabetes control. Ultimately this led to diabetic ketoacidosis and hospitalization in February 2017. Hemoglobin A1c was up to 15. Following this patient started to have increasing pain in her lower extremities especially her right leg. Patient had 3 different types of pain: Bilateral toe numbness and burning, right greater than left lower extremity pain, right thigh pain.  Patient is tried gabapentin without relief. Cymbalta was added which started to help symptom control. She purchased capsaicin cream but has not tried this yet. For past 8-10 years patient has had intermittent restless leg symptoms, foot cramps, especially at nighttime before going to sleep. She typically would have to stand up and walk around. Patient denies any significant low back pain. She denies low back pain radiating to legs. No problems with her hands or arms. Patient is now having better diabetes control and hemoglobin A1c is down to 10.   REVIEW OF SYSTEMS: Full 14 system review of systems performed and negative with exception of: Swelling in legs itching feeling cold.   ALLERGIES: Allergies  Allergen Reactions  . Doxycycline     Seeing spots  . Sulfa Antibiotics     Seeing spots  . Tape     Surgical tape     HOME MEDICATIONS: Outpatient Medications Prior to Visit  Medication Sig Dispense Refill  . DULoxetine (CYMBALTA) 30 MG capsule Take 3 capsules (90 mg total) by mouth daily. 270 capsule 4  . Gabapentin, Once-Daily, (GRALISE) 600 MG TABS Take 2,400 mg by mouth daily. 360 tablet 3  . glucagon (GLUCAGON EMERGENCY) 1 MG injection As needed    . insulin glargine (LANTUS) 100 UNIT/ML injection Inject into the skin. As needed, has pump    . insulin lispro (HUMALOG) 100 UNIT/ML injection Inject into the skin. humalog per pump    . NONFORMULARY OR COMPOUNDED ITEM Topical cream for neuropathy  (aspirar)    . Pramlintide Acetate (SYMLIN [place]) Inject into the skin. Titrated up to 60 mcg in 15 mcg increments, taking 45 mcg w/meals 12/24/18    . meloxicam (MOBIC)  7.5 MG tablet as needed. (Patient not taking: Reported on 08/16/2020)     No facility-administered medications prior to visit.    PAST MEDICAL HISTORY: Past Medical History:  Diagnosis Date  . Arthritis    left big toe  . CLL (chronic lymphocytic leukemia) ([place]) 2020  . Diabetes mellitus without complication (HCC)     insulin dependent  . Neuropathy    diabetic  . Pneumonia 12/2017    PAST SURGICAL HISTORY: Past Surgical History:  Procedure Laterality Date  . CESAREAN SECTION     x 3  . TUBAL LIGATION  04/2007    FAMILY HISTORY: Family History  Problem Relation Age of Onset  . Cancer Maternal Grandfather     SOCIAL HISTORY:  Social History   Socioeconomic History  . Marital status: Married    Spouse name: [name]  . Number of children: 4  . Years of education: 43  . Highest education level: Not on file  Occupational History    Comment: na  Tobacco Use  . Smoking status: Never Smoker  . Smokeless tobacco: Never Used  Substance and Sexual Activity  . Alcohol use: Yes    Comment: occasional  . Drug use: No  . Sexual activity: Not on file  Other Topics Concern  . Not on file  Social History Narrative   Lives at home with husband, 4 children   Caffeine -   Social Determinants of Health   Financial Resource Strain:   . Difficulty of Paying Living Expenses: Not on file  Food Insecurity:   . Worried About Charity fundraiser in the Last Year: Not on file  . Ran Out of Food in the Last Year: Not on file  Transportation Needs:   . Lack of Transportation (Medical): Not on file  . Lack of Transportation (Non-Medical): Not on file  Physical Activity:   . Days of Exercise per Week: Not on file  . Minutes of Exercise per Session: Not on file  Stress:   . Feeling of Stress : Not on file  Social Connections:   . Frequency of Communication with Friends and Family: Not on file  . Frequency of Social Gatherings with Friends and Family: Not on file  . Attends Religious Services: Not on file  . Active Member of Clubs or Organizations: Not on file  . Attends Archivist Meetings: Not on file  . Marital Status: Not on file  Intimate Partner Violence:   . Fear of Current or Ex-Partner: Not on file  . Emotionally Abused: Not on file  . Physically Abused: Not on file  . Sexually  Abused: Not on file     PHYSICAL EXAM  GENERAL EXAM/CONSTITUTIONAL: Vitals:  Vitals:   08/16/20 0922  BP: (!) 163/92  Pulse: 85  Weight: 165 lb (74.8 kg)  Height: 5\' 7"  (1.702 m)   Wt Readings from Last 3 Encounters:  08/16/20 165 lb (74.8 kg)  04/12/20 161 lb 12.8 oz (73.4 kg)  12/25/17 155 lb (70.3 kg)   Body mass index is 25.84 kg/m. No exam data present  Patient is in no distress; well developed, nourished and groomed; neck is supple  CARDIOVASCULAR:  Examination of carotid arteries is normal; no carotid bruits  Regular rate and rhythm, no murmurs  Examination of peripheral vascular system by observation and palpation is normal  EYES:  Ophthalmoscopic exam of optic discs and posterior segments is normal; no papilledema or hemorrhages  MUSCULOSKELETAL:  Gait, strength, tone, movements noted  in Neurologic exam below  NEUROLOGIC: MENTAL STATUS:  No flowsheet data found.  awake, alert, oriented to person, place and time  recent and remote memory intact  normal attention and concentration  language fluent, comprehension intact, naming intact,   fund of knowledge appropriate  CRANIAL NERVE:   2nd - no papilledema on fundoscopic exam  2nd, 3rd, 4th, 6th - pupils equal and reactive to light, visual fields full to confrontation, extraocular muscles intact, no nystagmus  5th - facial sensation symmetric  7th - facial strength symmetric  8th - hearing intact  9th - palate elevates symmetrically, uvula midline  11th - shoulder shrug symmetric  12th - tongue protrusion midline  MOTOR:   normal bulk and tone, full strength in the BUE, BLE  SENSORY:   normal and symmetric to light touch, temperature, vibration; DEC PP IN FEET/ANKLES  COORDINATION:   finger-nose-finger, fine finger movements normal  REFLEXES:   deep tendon reflexes TRACE and symmetric  GAIT/STATION:   narrow based gait    DIAGNOSTIC DATA (LABS, IMAGING, TESTING) - I  reviewed patient records, labs, notes, testing and imaging myself where available.  No results found for: WBC, HGB, HCT, MCV, PLT No results found for: NA, K, CL, CO2, GLUCOSE, BUN, CREATININE, CALCIUM, PROT, ALBUMIN, AST, ALT, ALKPHOS, BILITOT, GFRNONAA, GFRAA No results found for: CHOL, HDL, LDLCALC, LDLDIRECT, TRIG, CHOLHDL No results found for: HGBA1C No results found for: VITAMINB12 No results found for: TSH  04/09/16 BLE arterial u/s  - no evidence of arterial occlusive disease.  01/26/16 BLE venous u/s  - no evidence of DVT within the right lower extremity  12/16/15 EMG/NCS - Mild sensory polyneuropathy    ASSESSMENT AND PLAN  54 y.o. year old female here with type 1 insulin-dependent diabetes since age 30 years old, with worsening diabetes control in the last 1 year, now with evidence of diabetic peripheral neuropathy. Right lower extremity pain may represent focal diabetic neuropathy versus diabetic lumbosacral plexopathy.  Meds tried: gabapentin, cymbalta  Dx:  1. Diabetic polyneuropathy associated with type 1 diabetes mellitus ([place])      PLAN:  DIABETIC NEUROPATHY - continue gralise (extended release gabapentin) 2400mg  at dinner time - continue cymbalta 90mg  daily - consider capsaicin cream, lidocaine patch / cream, alpha-lipoic acid 600mg  daily - may need to consider pain mgmt referral in future  DIABETES CONTROL - continue to improve diabetes control with nutrition, exercise and insulin  INSOMNIA / SLEEP DEPRIVATION - continue to improve sleep hygiene - consider psychology evaluation  HYPERTENSION - check BP at home; follow up with PCP  Meds ordered this encounter  Medications  . DULoxetine (CYMBALTA) 30 MG capsule    Sig: Take 3 capsules (90 mg total) by mouth daily.    Dispense:  270 capsule    Refill:  4  . Gabapentin, Once-Daily, (GRALISE) 600 MG TABS    Sig: Take 4 tablets by mouth every evening.    Dispense:  360 tablet    Refill:  4    Return in about 1 year (around 08/16/2021).    Penni Bombard, MD 16/38/4665, 9:93 AM Certified in Neurology, Neurophysiology and Neuroimaging  [facility] [address] [place], [place] [address] [phone]

## 2020-12-20 ENCOUNTER — Telehealth: Payer: Self-pay | Admitting: Diagnostic Neuroimaging

## 2020-12-20 DIAGNOSIS — E1042 Type 1 diabetes mellitus with diabetic polyneuropathy: Secondary | ICD-10-CM

## 2020-12-20 NOTE — Telephone Encounter (Signed)
Pt is needing a refill on her Gabapentin, Once-Daily, (GRALISE) 600 MG TABS and her DULoxetine (CYMBALTA) 30 MG capsule sent in to the [facility]. Pt states that they are needing to be called in as a 90 day qty.

## 2020-12-20 NOTE — Telephone Encounter (Signed)
Called patient and advised her of both refills sent 08/16/20 to [facility]. She checked her bottles, is using older Rx #s. She will call [facility], call me back if she still has questions.  Patient verbalized understanding, appreciation.

## 2021-01-02 MED ORDER — DULOXETINE HCL 30 MG PO CPEP: 90.0000 mg | ORAL_CAPSULE | Freq: Every day | ORAL | 4 refills | Status: DC

## 2021-01-02 MED ORDER — GRALISE 600 MG PO TABS: 4.0000 | ORAL_TABLET | Freq: Every evening | ORAL | 4 refills | Status: DC

## 2021-01-02 NOTE — Addendum Note (Signed)
Addended by: Minna Antis on: 01/02/2021 04:16 PM   Modules accepted: Orders

## 2021-01-02 NOTE — Telephone Encounter (Signed)
Refills sent as requested

## 2021-01-02 NOTE — Telephone Encounter (Signed)
Pt called back saying [facility] does not have active prescriptions/refills for Gralise or Cymbalta. Needing new prescriptions sent in.

## 2021-01-03 ENCOUNTER — Telehealth: Payer: Self-pay | Admitting: Diagnostic Neuroimaging

## 2021-01-03 NOTE — Telephone Encounter (Signed)
No Prior Authorization is required at this time based on the alternative drug you chose to prescribe. Case [id number]. Called [facility], spoke with [place], [place] representative and advised her per cover my meds duloxetine doesn't require PA. She stated quantity limit PA ws needed. PA done on phone, approved 270 caps/83 days with test claim paid. She stated it will not approve 90 days.  Case id #[id number], effective  12/04/20-01/03/2024. Called patient to advise. Patient verbalized understanding, appreciation.

## 2021-01-03 NOTE — Telephone Encounter (Signed)
Called patient and got pharmacy card information: [facility], ID #[id number], rx bin [phone], rx group [email or web address], customer service [phone], issue #[id number].  She stated her bottle is Duloxetine.  Initiated PA on CMM, key: [id number].

## 2021-01-03 NOTE — Telephone Encounter (Signed)
Pt has called asking the following be added to her message BP:ZWCHENID.  Pt states what is needed now is a Plan Limit Authorization for her to get a prescription for more than 90 tablets for a 30 day time frame.  Pt states she is cancelling the current order since she is making this request of Rogue Jury for the Plan Limit Authorization.  If there are questions on this request, pt is asking for a call back.

## 2021-01-03 NOTE — Telephone Encounter (Signed)
Pt called, nurse sent in a prescription for 30-day supply for DULoxetine (CYMBALTA) 30 MG capsule which cost me more money. Need a prescription resent as a 90-day supply. Would like a call from the nurse.

## 2021-01-03 NOTE — Telephone Encounter (Signed)
Left detailed VM informing patient that both Rx were sent in yesterday for 90 day supply with additional refills, to [facility]. We received confirmation from pharmacy that both Rx went through. Advised they may be looking at older Rx, requested she call back for questions.

## 2021-04-17 ENCOUNTER — Telehealth: Payer: Self-pay | Admitting: Diagnostic Neuroimaging

## 2021-04-17 DIAGNOSIS — E1042 Type 1 diabetes mellitus with diabetic polyneuropathy: Secondary | ICD-10-CM

## 2021-04-17 MED ORDER — GRALISE 600 MG PO TABS: 4.0000 | ORAL_TABLET | Freq: Every evening | ORAL | 0 refills | Status: DC

## 2021-04-17 NOTE — Telephone Encounter (Signed)
I called [facility] back. I spoke with Marion and clarified this message.  Pt has spoke with pharmacy about receiving a 14 day supply while she is waiting for the mail service to send her supply of Gabapentin (Gralise). I have sent 14 day supply to [facility] and they will call the pt to advise once med is ready for pick up.

## 2021-04-17 NOTE — Telephone Encounter (Signed)
[facility] ([facility]) called,Gabapentin, Once-Daily, (GRALISE) 600 MG TABS refill send by mail order will not arrive in time. Request a bridge prescription for Gabapentin, Once-Daily, (GRALISE) 600 MG TABS . Would like a call from nurse.

## 2021-07-25 ENCOUNTER — Telehealth: Payer: Self-pay | Admitting: Neurology

## 2021-07-25 DIAGNOSIS — E1042 Type 1 diabetes mellitus with diabetic polyneuropathy: Secondary | ICD-10-CM

## 2021-07-25 MED ORDER — GRALISE 600 MG PO TABS: 4.0000 | ORAL_TABLET | Freq: Every evening | ORAL | 0 refills | Status: DC

## 2021-07-25 NOTE — Telephone Encounter (Signed)
On-Call M D:   Patient reports that for repeated times her Cymbalta and Gralise prescriptions were not mailed by mail -order pharmacy.  The 2 medications together have been flagged many times before and she requested a pre authorization , which will need to be done in office.   She will need another reauthorization letter.  In the meantime she will run out of Gralise and I offered her to send a 60 tablet supply locally to [facility].  Please , if possible , copy a letter of that kind to the patient's media tap for easy access. Larey Seat, MD

## 2021-07-26 NOTE — Telephone Encounter (Addendum)
Called patient who stated [facility] will not refill Gralise, stated it needs PA. She stated this has been ongoing issue. I informed her that typically we are notified if PA is needed for a med, but there is no documentation in past that Gralise needs PA. She confirmed her insurance information as [employer] under her husband's name.  ID [id number], group [id number] I advised her I'll call today and let her know outcome. She has 5 days med left, #60 tabs at local pharmacy to pick up if needed. Patient verbalized understanding, appreciation.  [facility], spoke with [place] review,  who stated gralise isn't covered under her plan. It doesn't require a clinical review (PA) but requires a medical (admin) review. Case ID # [id number]. Admin review initiated, re: she has been on Gralise since 2018, tolerates it well, it Is effective. Gabapentin caused sedation. Review was sent and denied by her plan. Patient will receive a letter, and she'll have right to appeal it. In order for provider to appeal we have to wait 24 hours for letter to be generated. Then call [phone], give them case # [id number]. They can then fax denial letter to [place] for provider to do appeal.  Called patient to update her. Patient verbalized understanding, appreciation. She will not pick up Rx yet.

## 2021-07-26 NOTE — Telephone Encounter (Signed)
07/26/2021 to follow MB RN

## 2021-07-27 NOTE — Telephone Encounter (Signed)
I attempted to call this the appeals department regarding this: In order for provider to appeal we have to wait 24 hours for letter to be generated. Then call [phone], give them case # [id number]. They can then fax denial letter to [place] for provider to do appeal.   Was on hold for 10 mins w/o rep picking up the phone will try again.

## 2021-07-27 NOTE — Telephone Encounter (Signed)
I called # back and was connected with Stanton Kidney, P and was able to request the letter for Gralise. [facility] letter should be received at the [phone] # within a few hours.

## 2021-07-31 NOTE — Telephone Encounter (Signed)
Appeal letter, supporting office notes faxed to [place], [place], f [phone] as urgent appeal.

## 2021-07-31 NOTE — Telephone Encounter (Addendum)
Denial letter for Gralise received. Will begin appeal process. Appeal letter on MD desk for review, signature.

## 2021-08-02 ENCOUNTER — Encounter: Payer: Self-pay | Admitting: Diagnostic Neuroimaging

## 2021-08-02 ENCOUNTER — Other Ambulatory Visit: Payer: Self-pay

## 2021-08-02 ENCOUNTER — Ambulatory Visit (INDEPENDENT_AMBULATORY_CARE_PROVIDER_SITE_OTHER): Payer: 59 | Admitting: Diagnostic Neuroimaging

## 2021-08-02 VITALS — BP 137/77 | HR 82 | Ht 67.0 in | Wt 158.4 lb

## 2021-08-02 DIAGNOSIS — E1042 Type 1 diabetes mellitus with diabetic polyneuropathy: Secondary | ICD-10-CM

## 2021-08-02 DIAGNOSIS — G5603 Carpal tunnel syndrome, bilateral upper limbs: Secondary | ICD-10-CM | POA: Diagnosis not present

## 2021-08-02 MED ORDER — DULOXETINE HCL 30 MG PO CPEP: 90.0000 mg | ORAL_CAPSULE | Freq: Every day | ORAL | 4 refills | Status: DC

## 2021-08-02 MED ORDER — GRALISE 600 MG PO TABS: 4.0000 | ORAL_TABLET | Freq: Every evening | ORAL | 12 refills | Status: DC

## 2021-08-02 NOTE — Progress Notes (Signed)
[facility]  PATIENT: Brandi Weber DOB: 1965-11-27  REFERRING CLINICIAN: Barbette Hair HISTORY FROM: patient REASON FOR VISIT: follow up   HISTORICAL  CHIEF COMPLAINT:  Chief Complaint  Patient presents with   Diabetic neuropathy    Rm 6, one year FU "no new concerns"    HISTORY OF PRESENT ILLNESS:   UPDATE (08/02/21, VRP): Since last visit, doing about the same. Symptoms are stable. Tolerating meds.   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 More swelling in bilateral feet. Insomnia continues (5 hours sleep per night). No alleviating or aggravating factors.    UPDATE (12/25/17, VRP): Since last visit, had fevers, chills, neuropathy pain a few weeks ago, dx'd with pneumonia. Now s/p antibiotics. Tolerating meds. No alleviating or aggravating factors. Gralise is better for sedation. Still with insomnia. Also with gradual weight gain. Also with decreased libido.   UPDATE (05/28/17, VRP): Since last visit, doing better. Tolerating gabapentin and compounded cream. No alleviating or aggravating factors.   UPDATE 08/31/16: Since last visit, was doing well, until last few weeks. More foot pain issues. More sleep issues (busy life) and averaging only 3-4 hours per night. Tolerating gabapentin 600mg  TID.   PRIOR HPI (05/18/16): 55 year old right-handed female here for evaluation of lower extremity pain. Patient has history of insulin-dependent type 1 diabetes since age 55 years old. Patient had done well until last year when she became somewhat burnt out with her diabetes management and therefore developed poor diabetes control. Ultimately this led to diabetic ketoacidosis and hospitalization in February 2017. Hemoglobin A1c was up to 15. Following this patient started to have increasing pain in her lower extremities especially her right leg. Patient had 3 different types of pain: Bilateral toe  numbness and burning, right greater than left lower extremity pain, right thigh pain. Patient is tried gabapentin without relief. Cymbalta was added which started to help symptom control. She purchased capsaicin cream but has not tried this yet. For past 8-10 years patient has had intermittent restless leg symptoms, foot cramps, especially at nighttime before going to sleep. She typically would have to stand up and walk around. Patient denies any significant low back pain. She denies low back pain radiating to legs. No problems with her hands or arms. Patient is now having better diabetes control and hemoglobin A1c is down to 10.   REVIEW OF SYSTEMS: Full 14 system review of systems performed and negative with exception of: Swelling in legs itching feeling cold.   ALLERGIES: Allergies  Allergen Reactions   Doxycycline     Seeing spots   Sulfa Antibiotics     Seeing spots   Tape     Surgical tape     HOME MEDICATIONS: Outpatient Medications Prior to Visit  Medication Sig Dispense Refill   glucagon 1 MG injection As needed     insulin glargine (LANTUS) 100 UNIT/ML injection Inject into the skin. As needed, has pump     insulin lispro (HUMALOG) 100 UNIT/ML injection Inject into the skin. humalog per pump     lisinopril (ZESTRIL) 2.5 MG tablet Take 2.5 mg by mouth daily.     DULoxetine (CYMBALTA) 30 MG capsule Take 3 capsules (90 mg total) by mouth daily. 270 capsule 4   Gabapentin, Once-Daily, (GRALISE) 600 MG TABS Take 4 tablets by mouth every evening. 60 tablet 0   meloxicam (MOBIC) 7.5 MG tablet as needed. (Patient not taking: No sig reported)  NONFORMULARY OR COMPOUNDED ITEM Topical cream for neuropathy  (aspirar) (Patient not taking: Reported on 08/02/2021)     Pramlintide Acetate (SYMLIN Danielson) Inject into the skin. Titrated up to 60 mcg in 15 mcg increments, taking 45 mcg w/meals 12/24/18 (Patient not taking: Reported on 08/02/2021)     No facility-administered medications prior to  visit.    PAST MEDICAL HISTORY: Past Medical History:  Diagnosis Date   Arthritis    left big toe   CLL (chronic lymphocytic leukemia) ([place]) 2020   Diabetes mellitus without complication (HCC)    insulin dependent   Neuropathy    diabetic   Pneumonia 12/2017    PAST SURGICAL HISTORY: Past Surgical History:  Procedure Laterality Date   CESAREAN SECTION     x 3   TUBAL LIGATION  04/2007    FAMILY HISTORY: Family History  Problem Relation Age of Onset   Cancer Maternal Grandfather     SOCIAL HISTORY:  Social History   Socioeconomic History   Marital status: Married    Spouse name: [name]   Number of children: 4   Years of education: 16   Highest education level: Not on file  Occupational History    Comment: na  Tobacco Use   Smoking status: Never   Smokeless tobacco: Never  Substance and Sexual Activity   Alcohol use: Yes    Comment: occasional   Drug use: No   Sexual activity: Not on file  Other Topics Concern   Not on file  Social History Narrative   Lives at home with husband, 4 children   Caffeine -   Social Determinants of Health   Financial Resource Strain: Not on file  Food Insecurity: Not on file  Transportation Needs: Not on file  Physical Activity: Not on file  Stress: Not on file  Social Connections: Not on file  Intimate Partner Violence: Not on file     PHYSICAL EXAM  GENERAL EXAM/CONSTITUTIONAL: Vitals:  Vitals:   08/02/21 1503  BP: 137/77  Pulse: 82  Weight: 158 lb 6.4 oz (71.8 kg)  Height: 5\' 7"  (1.702 m)   Wt Readings from Last 3 Encounters:  08/02/21 158 lb 6.4 oz (71.8 kg)  08/16/20 165 lb (74.8 kg)  04/12/20 161 lb 12.8 oz (73.4 kg)   Body mass index is 24.81 kg/m. No results found. Patient is in no distress; well developed, nourished and groomed; neck is supple  CARDIOVASCULAR: Examination of carotid arteries is normal; no carotid bruits Regular rate and rhythm, no murmurs Examination of peripheral vascular  system by observation and palpation is normal  EYES: Ophthalmoscopic exam of optic discs and posterior segments is normal; no papilledema or hemorrhages  MUSCULOSKELETAL: Gait, strength, tone, movements noted in Neurologic exam below  NEUROLOGIC: MENTAL STATUS:  No flowsheet data found. awake, alert, oriented to person, place and time recent and remote memory intact normal attention and concentration language fluent, comprehension intact, naming intact,  fund of knowledge appropriate  CRANIAL NERVE:  2nd - no papilledema on fundoscopic exam 2nd, 3rd, 4th, 6th - pupils equal and reactive to light, visual fields full to confrontation, extraocular muscles intact, no nystagmus 5th - facial sensation symmetric 7th - facial strength symmetric 8th - hearing intact 9th - palate elevates symmetrically, uvula midline 11th - shoulder shrug symmetric 12th - tongue protrusion midline  MOTOR:  normal bulk and tone, full strength in the BUE, BLE  SENSORY:  normal and symmetric to light touch, temperature, vibration; DEC PP IN FEET/ANKLES  COORDINATION:  finger-nose-finger, fine finger movements normal  REFLEXES:  deep tendon reflexes TRACE and symmetric  GAIT/STATION:  narrow based gait    DIAGNOSTIC DATA (LABS, IMAGING, TESTING) - I reviewed patient records, labs, notes, testing and imaging myself where available.  No results found for: WBC, HGB, HCT, MCV, PLT No results found for: NA, K, CL, CO2, GLUCOSE, BUN, CREATININE, CALCIUM, PROT, ALBUMIN, AST, ALT, ALKPHOS, BILITOT, GFRNONAA, GFRAA No results found for: CHOL, HDL, LDLCALC, LDLDIRECT, TRIG, CHOLHDL No results found for: HGBA1C No results found for: VITAMINB12 No results found for: TSH  07/14/21 A1c 8.7  04/09/16 BLE arterial u/s  - no evidence of arterial occlusive disease.  01/26/16 BLE venous u/s  - no evidence of DVT within the right lower extremity  12/16/15 EMG/NCS - Mild sensory polyneuropathy  05/21/19  EMG/NCS - Severe right and moderate left median neuropathies at the wrist consistent with severe right and moderate left carpal tunnel syndrome.    ASSESSMENT AND PLAN  55 y.o. year old female here with type 1 insulin-dependent diabetes since age 26 years old, with worsening diabetes control in the last 1 year, now with evidence of diabetic peripheral neuropathy. Right lower extremity pain may represent focal diabetic neuropathy versus diabetic lumbosacral plexopathy.  Meds tried: gabapentin, cymbalta  Dx:  1. Diabetic polyneuropathy associated with type 1 diabetes mellitus ([place])   2. Bilateral carpal tunnel syndrome      PLAN:  DIABETIC NEUROPATHY - continue gralise (extended release gabapentin) 2400mg  at dinner time - continue cymbalta 90mg  daily - consider capsaicin cream, lidocaine patch / cream, alpha-lipoic acid 600mg  daily - may need to consider pain mgmt referral in future  BILATERAL CARPAL TUNNEL SYNDROME - monitor; consider hand surgery consult (patient wants to hold off for now)  DIABETES CONTROL - continue to improve diabetes control with nutrition, exercise and insulin  INSOMNIA / SLEEP DEPRIVATION - continue to improve sleep hygiene - consider psychology evaluation  HYPERTENSION - check BP at home; follow up with PCP  Meds ordered this encounter  Medications   DULoxetine (CYMBALTA) 30 MG capsule    Sig: Take 3 capsules (90 mg total) by mouth daily.    Dispense:  270 capsule    Refill:  4   Gabapentin, Once-Daily, (GRALISE) 600 MG TABS    Sig: Take 4 tablets by mouth every evening.    Dispense:  120 tablet    Refill:  12    Return in about 1 year (around 08/02/2022) for with NP Frann Rider), MyChart visit (15 min).    Penni Bombard, MD 16/03/3709, 6:26 PM Certified in Neurology, Neurophysiology and Neuroimaging  [facility] [address], [place], [place] [address] [phone]

## 2021-08-02 NOTE — Telephone Encounter (Signed)
Patient in office for one year follow up. She stated that her husband's work called [employer] on her behalf. The Gralise was refilled without issues.

## 2022-01-11 ENCOUNTER — Telehealth: Payer: Self-pay | Admitting: Diagnostic Neuroimaging

## 2022-01-11 NOTE — Telephone Encounter (Signed)
LVM informing patient that MD refilled both medications 08/02/21 x [facility]. Left # for questions. ?

## 2022-01-11 NOTE — Telephone Encounter (Signed)
Pt called stating that she is needing a refill on her Gabapentin, Once-Daily, (GRALISE) 600 MG TABS which she takes in total 2,'400mg'$  a day and she is needing [facility] to do a 90 day qt ?She is also needing her DULoxetine (CYMBALTA) 30 MG capsule which she takes a total of '90mg'$  per day and she is needing [facility] to send a 90 day qt ? ?

## 2022-01-15 ENCOUNTER — Telehealth: Payer: Self-pay | Admitting: Diagnostic Neuroimaging

## 2022-01-15 DIAGNOSIS — E1042 Type 1 diabetes mellitus with diabetic polyneuropathy: Secondary | ICD-10-CM

## 2022-01-15 MED ORDER — GRALISE 600 MG PO TABS: 4.0000 | ORAL_TABLET | Freq: Every evening | ORAL | 4 refills | Status: AC

## 2022-01-15 MED ORDER — DULOXETINE HCL 30 MG PO CPEP: 90.0000 mg | ORAL_CAPSULE | Freq: Every day | ORAL | 4 refills | Status: AC

## 2022-01-15 NOTE — Telephone Encounter (Signed)
Pt did not write prescription for 90 day supply. States if are written for 30 day supply she is having to pay 3x as much due to mail order pharmacy.  ?Pt requesting new subscription of 90 day supply ofGabapentin, Once-Daily, (GRALISE) 600 MG TABS  and DULoxetine (CYMBALTA) 30 MG capsule be sent to  [facility], [place].  ? ?

## 2022-01-15 NOTE — Telephone Encounter (Signed)
I have corrected and re sent to the pharmacy.  ?

## 2022-01-17 NOTE — Telephone Encounter (Signed)
Contacted pt, informed her 23 supply was sent 01/15/22, conformation received. She stated she will contact pharmacy and see what's going on and give [place] a call back if need.  ?

## 2022-01-17 NOTE — Telephone Encounter (Signed)
Pt states a 30 day supply for DULoxetine (CYMBALTA) 30 MG capsule  was sent in instead of 90 day supply.  ?Pt states she is needing this prescription for 90 day supply at [facility] so she can save the money.  ?Pt would like a call back.  ?

## 2022-02-19 NOTE — Telephone Encounter (Signed)
Called [facility], spoke with Chelsey who stated PA needed for 90 day quantity. PA done on phone, approved 01/20/22 to 02/19/23.  MD and patient will be notified. Case [id number] Called patient and advised her.

## 2022-02-19 NOTE — Telephone Encounter (Signed)
Pt said cannot get a 90-day supply for DULoxetine (CYMBALTA) 30 MG capsule because [facility] needs PA for 90-day supply. Would like a call from the nurse confirm PA has been sent to [facility].

## 2022-05-17 ENCOUNTER — Telehealth: Payer: Self-pay | Admitting: Diagnostic Neuroimaging

## 2022-05-17 NOTE — Telephone Encounter (Signed)
Called Pt. Left message to call office to reschedule appointment for 11/20 MD will be out office.

## 2022-08-06 ENCOUNTER — Ambulatory Visit: Payer: 59 | Admitting: Diagnostic Neuroimaging
